# Patient Record
Sex: Female | Born: 1960 | Race: White | Hispanic: No | Marital: Married | Smoking: Never smoker
Health system: Southern US, Community
[De-identification: ages and names within clinical notes are randomized; demographics above are authoritative.]

## PROBLEM LIST (undated history)

## (undated) DIAGNOSIS — N390 Urinary tract infection, site not specified: Secondary | ICD-10-CM

## (undated) DIAGNOSIS — Z8619 Personal history of other infectious and parasitic diseases: Secondary | ICD-10-CM

## (undated) DIAGNOSIS — R519 Headache, unspecified: Secondary | ICD-10-CM

## (undated) DIAGNOSIS — K5792 Diverticulitis of intestine, part unspecified, without perforation or abscess without bleeding: Secondary | ICD-10-CM

## (undated) DIAGNOSIS — R51 Headache: Secondary | ICD-10-CM

## (undated) DIAGNOSIS — K5140 Inflammatory polyps of colon without complications: Secondary | ICD-10-CM

## (undated) DIAGNOSIS — G43909 Migraine, unspecified, not intractable, without status migrainosus: Secondary | ICD-10-CM

## (undated) HISTORY — DX: Inflammatory polyps of colon without complications: K51.40

## (undated) HISTORY — DX: Headache: R51

## (undated) HISTORY — PX: SQUAMOUS CELL CARCINOMA EXCISION: SHX2433

## (undated) HISTORY — DX: Personal history of other infectious and parasitic diseases: Z86.19

## (undated) HISTORY — DX: Headache, unspecified: R51.9

## (undated) HISTORY — DX: Diverticulitis of intestine, part unspecified, without perforation or abscess without bleeding: K57.92

## (undated) HISTORY — DX: Urinary tract infection, site not specified: N39.0

## (undated) HISTORY — DX: Migraine, unspecified, not intractable, without status migrainosus: G43.909

## (undated) HISTORY — PX: ABDOMINAL HYSTERECTOMY: SHX81

## (undated) HISTORY — PX: TOTAL ABDOMINAL HYSTERECTOMY: SHX209

---

## 1998-05-11 ENCOUNTER — Other Ambulatory Visit: Admission: RE | Admit: 1998-05-11 | Discharge: 1998-05-11 | Payer: Self-pay | Admitting: *Deleted

## 1999-05-11 ENCOUNTER — Other Ambulatory Visit: Admission: RE | Admit: 1999-05-11 | Discharge: 1999-05-11 | Payer: Self-pay | Admitting: *Deleted

## 1999-06-28 ENCOUNTER — Other Ambulatory Visit: Admission: RE | Admit: 1999-06-28 | Discharge: 1999-06-28 | Payer: Self-pay | Admitting: *Deleted

## 1999-07-03 ENCOUNTER — Inpatient Hospital Stay (HOSPITAL_COMMUNITY): Admission: RE | Admit: 1999-07-03 | Discharge: 1999-07-05 | Payer: Self-pay | Admitting: *Deleted

## 2003-03-01 ENCOUNTER — Other Ambulatory Visit: Admission: RE | Admit: 2003-03-01 | Discharge: 2003-03-01 | Payer: Self-pay | Admitting: Obstetrics and Gynecology

## 2010-02-19 LAB — CBC AND DIFFERENTIAL
Neutrophils Absolute: 91 /uL
WBC: 16.5 10^3/mL

## 2011-07-03 LAB — HM MAMMOGRAPHY: HM Mammogram: NORMAL

## 2012-01-08 LAB — HM PAP SMEAR: HM Pap smear: NORMAL

## 2012-01-08 LAB — HM MAMMOGRAPHY: HM Mammogram: NORMAL

## 2012-07-23 ENCOUNTER — Emergency Department (HOSPITAL_COMMUNITY): Payer: BC Managed Care – PPO

## 2012-07-23 ENCOUNTER — Encounter (HOSPITAL_COMMUNITY): Payer: Self-pay | Admitting: Emergency Medicine

## 2012-07-23 ENCOUNTER — Emergency Department (HOSPITAL_COMMUNITY)
Admission: EM | Admit: 2012-07-23 | Discharge: 2012-07-23 | Disposition: A | Discharge status: Home / Self Care | Payer: BC Managed Care – PPO | Attending: Emergency Medicine | Admitting: Emergency Medicine

## 2012-07-23 DIAGNOSIS — D72829 Elevated white blood cell count, unspecified: Secondary | ICD-10-CM

## 2012-07-23 DIAGNOSIS — R109 Unspecified abdominal pain: Secondary | ICD-10-CM

## 2012-07-23 DIAGNOSIS — Z79899 Other long term (current) drug therapy: Secondary | ICD-10-CM | POA: Insufficient documentation

## 2012-07-23 LAB — CBC WITH DIFFERENTIAL/PLATELET
Basophils Absolute: 0 10*3/uL (ref 0.0–0.1)
Basophils Relative: 0 % (ref 0–1)
Eosinophils Absolute: 0 10*3/uL (ref 0.0–0.7)
Eosinophils Relative: 0 % (ref 0–5)
HCT: 36.6 % (ref 36.0–46.0)
Hemoglobin: 12.6 g/dL (ref 12.0–15.0)
Lymphocytes Relative: 4 % — ABNORMAL LOW (ref 12–46)
Lymphs Abs: 0.7 10*3/uL (ref 0.7–4.0)
MCH: 29.2 pg (ref 26.0–34.0)
MCHC: 34.4 g/dL (ref 30.0–36.0)
MCV: 84.7 fL (ref 78.0–100.0)
Monocytes Absolute: 0.8 10*3/uL (ref 0.1–1.0)
Monocytes Relative: 5 % (ref 3–12)
Neutro Abs: 14.9 10*3/uL — ABNORMAL HIGH (ref 1.7–7.7)
Neutrophils Relative %: 91 % — ABNORMAL HIGH (ref 43–77)
Platelets: 240 10*3/uL (ref 150–400)
RBC: 4.32 MIL/uL (ref 3.87–5.11)
RDW: 12.5 % (ref 11.5–15.5)
WBC: 16.5 10*3/uL — ABNORMAL HIGH (ref 4.0–10.5)

## 2012-07-23 LAB — BASIC METABOLIC PANEL
BUN: 21 mg/dL (ref 6–23)
CO2: 24 mEq/L (ref 19–32)
Calcium: 9.7 mg/dL (ref 8.4–10.5)
Chloride: 100 mEq/L (ref 96–112)
Creatinine, Ser: 0.65 mg/dL (ref 0.50–1.10)
GFR calc Af Amer: >90 mL/min (ref >90)
GFR calc non Af Amer: >90 mL/min (ref >90)
Glucose, Bld: 141 mg/dL — ABNORMAL HIGH (ref 70–99)
Potassium: 3.7 mEq/L (ref 3.5–5.1)
Sodium: 138 mEq/L (ref 135–145)

## 2012-07-23 LAB — URINALYSIS, ROUTINE W REFLEX MICROSCOPIC
Bilirubin Urine: NEGATIVE
Glucose, UA: NEGATIVE mg/dL
Hgb urine dipstick: NEGATIVE
Ketones, ur: NEGATIVE mg/dL
Leukocytes, UA: NEGATIVE
Nitrite: NEGATIVE
Protein, ur: NEGATIVE mg/dL
Specific Gravity, Urine: 1.019 (ref 1.005–1.030)
Urobilinogen, UA: 0.2 mg/dL (ref 0.0–1.0)
pH: 7.5 (ref 5.0–8.0)

## 2012-07-23 MED ORDER — IOHEXOL 300 MG/ML  SOLN
50.0000 mL | Freq: Once | INTRAMUSCULAR | Status: AC | PRN
  Administered 2012-07-23: 50 mL via ORAL

## 2012-07-23 MED ORDER — ACETAMINOPHEN 325 MG PO TABS
975.0000 mg | ORAL_TABLET | Freq: Once | ORAL | Status: AC
  Administered 2012-07-23: 975 mg via ORAL
  Filled 2012-07-23: qty 3

## 2012-07-23 MED ORDER — MORPHINE SULFATE 4 MG/ML IJ SOLN
4.0000 mg | Freq: Once | INTRAMUSCULAR | Status: AC
  Administered 2012-07-23: 4 mg via INTRAVENOUS
  Filled 2012-07-23: qty 1

## 2012-07-23 MED ORDER — SODIUM CHLORIDE 0.9 % IV SOLN
Freq: Once | INTRAVENOUS | Status: AC
  Administered 2012-07-23: 07:00:00 via INTRAVENOUS

## 2012-07-23 MED ORDER — ONDANSETRON HCL 4 MG PO TABS: 4.0000 mg | ORAL_TABLET | Freq: Four times a day (QID) | ORAL | Status: DC

## 2012-07-23 MED ORDER — HYDROCODONE-ACETAMINOPHEN 5-325 MG PO TABS: 1.0000 | ORAL_TABLET | ORAL | Status: DC | PRN

## 2012-07-23 MED ORDER — IOHEXOL 300 MG/ML  SOLN
100.0000 mL | Freq: Once | INTRAMUSCULAR | Status: AC | PRN
  Administered 2012-07-23: 100 mL via INTRAVENOUS

## 2012-07-23 NOTE — ED Provider Notes (Signed)
History    CSN: [id number] Arrival date & time 07/23/12  0630  First MD Initiated Contact with Patient 07/23/12 [phone]     Chief Complaint  Patient presents with  . Abdominal Pain   (Consider location/radiation/quality/duration/timing/severity/associated sxs/prior Treatment) Patient is a 52 y.o. female presenting with abdominal pain. The history is provided by the patient and the spouse. No language interpreter was used.  Abdominal Pain This is a new problem. The current episode started yesterday. The problem occurs constantly. Associated symptoms include abdominal pain. Pertinent negatives include no chest pain, chills, fever, headaches, nausea or vomiting. Associated symptoms comments: Onset abdominal pain last night in lower center abdomen, radiating to right and left lower quadrants. No nausea, vomiting, fever, dysuria or change in bowel habits. Pain is constant. She reports history of partial hysterectomy secondary to uterine fibroids. .   History reviewed. No pertinent past medical history. Past Surgical History  Procedure Laterality Date  . Abdominal hysterectomy     No family history on file. History  Substance Use Topics  . Smoking status: Never Smoker   . Smokeless tobacco: Not on file  . Alcohol Use: Yes     Comment: occasional   OB History   Grav Para Term Preterm Abortions TAB SAB Ect Mult Living                 Review of Systems  Constitutional: Negative for fever and chills.  HENT: Negative.   Respiratory: Negative.  Negative for shortness of breath.   Cardiovascular: Negative.  Negative for chest pain.  Gastrointestinal: Positive for abdominal pain. Negative for nausea and vomiting.  Genitourinary: Negative for dysuria and flank pain.  Musculoskeletal: Negative.   Skin: Negative.   Neurological: Negative.  Negative for headaches.  Psychiatric/Behavioral: Negative for confusion.    Allergies  Review of patient's allergies indicates no known  allergies.  Home Medications   Current Outpatient Rx  Name  Route  Sig  Dispense  Refill  . BLACK COHOSH PO   Oral   Take 1 capsule by mouth daily.         . calcium carbonate (TUMS - DOSED IN MG ELEMENTAL CALCIUM) 500 MG chewable tablet   Oral   Chew 1 tablet by mouth daily.         . Multiple Vitamin (MULTIVITAMIN WITH MINERALS) TABS   Oral   Take 1 tablet by mouth daily.         . vitamin E 400 UNIT capsule   Oral   Take 400 Units by mouth daily.          BP 107/51  Pulse 79  Temp(Src) 97.9 F (36.6 C) (Oral)  Resp 16  Ht 5\' 5"  (1.651 m)  Wt 120 lb (54.432 kg)  BMI 19.97 kg/m2  SpO2 98% Physical Exam  Constitutional: She is oriented to person, place, and time. She appears well-developed and well-nourished.  HENT:  Head: Normocephalic.  Neck: Normal range of motion. Neck supple.  Cardiovascular: Normal rate and regular rhythm.   Pulmonary/Chest: Effort normal and breath sounds normal.  Abdominal: Soft. Bowel sounds are normal. There is tenderness. There is no rebound and no guarding.  Right greater than left lower abdominal tenderness without rebound or guarding. No upper abdominal tenderness, specifically no RUQ tenderness.    Genitourinary: Vagina normal and uterus normal. No vaginal discharge found.  No adnexal mass. There is bilateral tenderness in adnexa. Cervix absent. No discharge or bleeding in vaginal vault.   Musculoskeletal:  Normal range of motion.  Neurological: She is alert and oriented to person, place, and time.  Skin: Skin is warm and dry. No rash noted.  Psychiatric: She has a normal mood and affect.    ED Course  Procedures (including critical care time) Labs Reviewed  CBC WITH DIFFERENTIAL - Abnormal; Notable for the following:    WBC 16.5 (*)    Neutrophils Relative % 91 (*)    Neutro Abs 14.9 (*)    Lymphocytes Relative 4 (*)    All other components within normal limits  BASIC METABOLIC PANEL - Abnormal; Notable for the  following:    Glucose, Bld 141 (*)    All other components within normal limits  URINALYSIS, ROUTINE W REFLEX MICROSCOPIC   Results for orders placed during the hospital encounter of 07/23/12  URINALYSIS, ROUTINE W REFLEX MICROSCOPIC      Result Value Range   Color, Urine YELLOW  YELLOW   APPearance CLEAR  CLEAR   Specific Gravity, Urine 1.019  1.005 - 1.030   pH 7.5  5.0 - 8.0   Glucose, UA NEGATIVE  NEGATIVE mg/dL   Hgb urine dipstick NEGATIVE  NEGATIVE   Bilirubin Urine NEGATIVE  NEGATIVE   Ketones, ur NEGATIVE  NEGATIVE mg/dL   Protein, ur NEGATIVE  NEGATIVE mg/dL   Urobilinogen, UA 0.2  0.0 - 1.0 mg/dL   Nitrite NEGATIVE  NEGATIVE   Leukocytes, UA NEGATIVE  NEGATIVE  CBC WITH DIFFERENTIAL      Result Value Range   WBC 16.5 (*) 4.0 - 10.5 K/uL   RBC 4.32  3.87 - 5.11 MIL/uL   Hemoglobin 12.6  12.0 - 15.0 g/dL   HCT 16.1  09.6 - 04.5 %   MCV 84.7  78.0 - 100.0 fL   MCH 29.2  26.0 - 34.0 pg   MCHC 34.4  30.0 - 36.0 g/dL   RDW 40.9  81.1 - 91.4 %   Platelets 240  150 - 400 K/uL   Neutrophils Relative % 91 (*) 43 - 77 %   Neutro Abs 14.9 (*) 1.7 - 7.7 K/uL   Lymphocytes Relative 4 (*) 12 - 46 %   Lymphs Abs 0.7  0.7 - 4.0 K/uL   Monocytes Relative 5  3 - 12 %   Monocytes Absolute 0.8  0.1 - 1.0 K/uL   Eosinophils Relative 0  0 - 5 %   Eosinophils Absolute 0.0  0.0 - 0.7 K/uL   Basophils Relative 0  0 - 1 %   Basophils Absolute 0.0  0.0 - 0.1 K/uL  BASIC METABOLIC PANEL      Result Value Range   Sodium 138  135 - 145 mEq/L   Potassium 3.7  3.5 - 5.1 mEq/L   Chloride 100  96 - 112 mEq/L   CO2 24  19 - 32 mEq/L   Glucose, Bld 141 (*) 70 - 99 mg/dL   BUN 21  6 - 23 mg/dL   Creatinine, Ser 7.82  0.50 - 1.10 mg/dL   Calcium 9.7  8.4 - 95.6 mg/dL   GFR calc non Af Amer >90  >90 mL/min   GFR calc Af Amer >90  >90 mL/min   US Transvaginal Non-ob  07/23/2012   *RADIOLOGY REPORT*  Clinical Data: Abdominal and pelvic pain.  TRANSABDOMINAL AND TRANSVAGINAL ULTRASOUND OF  PELVIS Technique:  Both transabdominal and transvaginal ultrasound examinations of the pelvis were performed. Transabdominal technique was performed for global imaging of the pelvis including cuff, ovaries,  adnexal regions, and pelvic cul-de-sac.  It was necessary to proceed with endovaginal exam following the transabdominal exam to visualize the vaginal cuff and adnexa.  Comparison:  Concurrent CT  Findings:  Uterus: Has been surgically removed.  A normal vaginal cuff is seen  Endometrium: Not applicable  Right ovary:  Is not seen with confidence either transabdominally or endovaginally  Left ovary: Is not seen with confidence either transabdominally or endovaginally  Other findings: No pelvic fluid or definite adnexal masses are seen  IMPRESSION: Non visualized ovaries.  Normal vaginal cuff.   Original Report Authenticated By: Rhodia Albright, M.D.   US Pelvis Complete  07/23/2012   *RADIOLOGY REPORT*  Clinical Data: Abdominal and pelvic pain.  TRANSABDOMINAL AND TRANSVAGINAL ULTRASOUND OF PELVIS Technique:  Both transabdominal and transvaginal ultrasound examinations of the pelvis were performed. Transabdominal technique was performed for global imaging of the pelvis including cuff, ovaries, adnexal regions, and pelvic cul-de-sac.  It was necessary to proceed with endovaginal exam following the transabdominal exam to visualize the vaginal cuff and adnexa.  Comparison:  Concurrent CT  Findings:  Uterus: Has been surgically removed.  A normal vaginal cuff is seen  Endometrium: Not applicable  Right ovary:  Is not seen with confidence either transabdominally or endovaginally  Left ovary: Is not seen with confidence either transabdominally or endovaginally  Other findings: No pelvic fluid or definite adnexal masses are seen  IMPRESSION: Non visualized ovaries.  Normal vaginal cuff.   Original Report Authenticated By: Rhodia Albright, M.D.   Ct Abdomen Pelvis W Contrast  07/23/2012   **ADDENDUM** CREATED:  07/23/2012 10:16:07  The patient has  right pelvic pain.  The right ovary is not seen. The uterus has been removed.  There is mild fullness of the left ovary with a small amount of free fluid in the left lower pelvis.  **END ADDENDUM** SIGNED BY: Dineen Kid. Chestine Spore, M.D.  07/23/2012   *RADIOLOGY REPORT*  Clinical Data: Abdominal pain  CT ABDOMEN AND PELVIS WITH CONTRAST  Technique:  Multidetector CT imaging of the abdomen and pelvis was performed following the standard protocol during bolus administration of intravenous contrast.  Contrast: OMNIPAQUE IOHEXOL 300 MG/ML  SOLN  Comparison: None.  Findings: Lung bases are clear.  Heart size is normal.  Liver and bile ducts are normal.  Multiple gallstones are present in the gallbladder.  The gallbladder is distended without gallbladder wall thickening.  The pancreas and spleen are normal.  The kidneys are normal without obstruction or mass.  No renal calculi.  Negative for bowel obstruction.  The appendix is normal.  No bowel thickening is present.  Negative for mass or adenopathy.  Negative for lumbar fracture.  IMPRESSION: Gallstones.  No gallbladder wall thickening or biliary ductal dilatation.   Original Report Authenticated By: Janeece Riggers, M.D.   No results found. No diagnosis found. 1. Abdominal pain 2. Leukocytosis  MDM  She is more comfortable with IV medications. Complains of headache onset after arrival, treated with Tylenol. She has an unexplained leukocytosis with normal CT and pelvic US. She is stable for discharge and is encouraged to follow up with her PCP for further out patient evaluation.  Arnoldo Hooker, PA-C 07/23/12 1254

## 2012-07-23 NOTE — ED Notes (Signed)
Per EMS pt transported from home c/o lower abd pain onset 2200 last night, denies n/v/d per EMS. Per EMS pt c/o becoming dizzy after urination last evening.

## 2012-07-23 NOTE — ED Notes (Signed)
Pt c/o mid low abd pain onset last night, pt denies GYN s/s, denies urinary s/s. Denies n/v/d. Pt is acutely tender RLQ. Pt unable to straighten legs on stretcher d/t pain. Husband at bedside.

## 2012-07-23 NOTE — ED Notes (Signed)
Patient transported to CT 

## 2012-07-23 NOTE — ED Notes (Signed)
[id number]<FA> Expected date:<BR> Expected time:<BR> Means of arrival:<BR> Comments:<BR> EMS 52yo F, abd pain, c/o dizziness upon urination

## 2012-07-24 NOTE — ED Provider Notes (Signed)
Medical screening examination/treatment/procedure(s) were conducted as a shared visit with non-physician practitioner(s) and myself.  I personally evaluated the patient during the encounter.  No acute abdomen. Ultrasound of pelvis shows no acute anomalies.   CT of abdomen and pelvis show incidental gallstones  Donnetta Hutching, MD 07/24/12 1021

## 2012-08-07 LAB — HM COLONOSCOPY: HM Colonoscopy: NORMAL

## 2012-08-12 LAB — BASIC METABOLIC PANEL
BUN: 17 mg/dL (ref 4–21)
Creatinine: 0.7 mg/dL (ref <=1.1)
Sodium: 139 mmol/L (ref 137–147)

## 2012-08-24 LAB — HM COLONOSCOPY

## 2012-09-25 ENCOUNTER — Ambulatory Visit (INDEPENDENT_AMBULATORY_CARE_PROVIDER_SITE_OTHER): Payer: Self-pay | Admitting: General Surgery

## 2012-09-28 ENCOUNTER — Ambulatory Visit (INDEPENDENT_AMBULATORY_CARE_PROVIDER_SITE_OTHER): Payer: BC Managed Care – PPO | Admitting: General Surgery

## 2012-09-28 ENCOUNTER — Encounter (INDEPENDENT_AMBULATORY_CARE_PROVIDER_SITE_OTHER): Payer: Self-pay | Admitting: General Surgery

## 2012-09-28 VITALS — BP 110/78 | HR 72 | Resp 14 | Ht 65.0 in | Wt 119.6 lb

## 2012-09-28 DIAGNOSIS — K8020 Calculus of gallbladder without cholecystitis without obstruction: Secondary | ICD-10-CM

## 2012-09-28 NOTE — Progress Notes (Signed)
Subjective:     Patient ID: Melanie Park, female   DOB: Jul 23, 1960, 52 y.o.   MRN: [id number]  HPI The patient is a 52 year old female who was referred by Dr. Paulino Rily for evaluation of biliary colic. In April of this year patient was seen in the ER secondary to lower abdominal pain. The patient stated she had not had any pain like this previously. Upon evaluation in the patient underwent laboratory studies as well as a CT scan and ultrasound which revealed a dilated gallbladder with stones, and sphincter within the pelvis. At that time there was liver panel that was ordered.  At the time the patient had no nausea or vomiting, or fevers.  Discussed with the patient she has not had pain in the right upper quadrant or epigastrium, at any time.   Review of Systems  Constitutional: Negative.   HENT: Negative.   Respiratory: Negative.   Cardiovascular: Negative.   Gastrointestinal: Negative.   Neurological: Negative.   All other systems reviewed and are negative.       Objective:   Physical Exam  Constitutional: She is oriented to person, place, and time. She appears well-developed and well-nourished.  HENT:  Head: Normocephalic and atraumatic.  Eyes: Conjunctivae and EOM are normal. Pupils are equal, round, and reactive to light.  Neck: Normal range of motion.  Cardiovascular: Normal rate, regular rhythm and normal heart sounds.   Pulmonary/Chest: Effort normal and breath sounds normal.  Abdominal: There is tenderness in the right upper quadrant.  Musculoskeletal: Normal range of motion.  Neurological: She is alert and oriented to person, place, and time.  Skin: Skin is warm and dry.       Assessment:     52 year old female with gallstones, nonsymptomatic     Plan:     1. I discussed with the patient the results of the CT scan and the likelihood that this is not related to her gallbladder or biliary colic. I discussed the signs and symptoms of biliary colic and her symptoms  had to be assisted with her gallstones. We discussed the fact that usually this pain occurs after eating high fatty meals or spicy foods an occurs in the right upper quadrant.  Should any pain in this area  Occur I would be happy to see the patient back for surgical consultation. 2. The patient follow up as needed

## 2013-08-02 ENCOUNTER — Ambulatory Visit (INDEPENDENT_AMBULATORY_CARE_PROVIDER_SITE_OTHER): Payer: BC Managed Care – PPO | Admitting: Family Medicine

## 2013-08-02 ENCOUNTER — Encounter: Payer: Self-pay | Admitting: Family Medicine

## 2013-08-02 VITALS — BP 120/84 | HR 64 | Temp 98.2°F | Resp 16 | Ht 64.25 in | Wt 122.2 lb

## 2013-08-02 DIAGNOSIS — Z0000 Encounter for general adult medical examination without abnormal findings: Secondary | ICD-10-CM

## 2013-08-02 NOTE — Patient Instructions (Signed)
Follow up in 1 year or as needed Keep up the good work!  You look great! We'll notify you of your lab results and make any changes if needed Call and ask your insurance company about the shingles vaccine- if they cover it, you can schedule a nurse visit Call with any questions or concerns Welcome!  We're glad to have you!

## 2013-08-02 NOTE — Progress Notes (Signed)
Pre visit review using our clinic review tool, if applicable. No additional management support is needed unless otherwise documented below in the visit note. 

## 2013-08-02 NOTE — Progress Notes (Signed)
   Subjective:    Patient ID: Melanie Park, female    DOB: 1960/10/08, 53 y.o.   MRN: [id number]  HPI New to establish.  Previous MD- Paulino RilyWolters Deboraha Sprang([facility])  GI- [facility]  CPE- UTD on colonoscopy, mammo.  No need for paps due to hysterectomy but ovaries remain.   Review of Systems Patient reports no vision/ hearing changes, adenopathy,fever, weight change,  persistant/recurrent hoarseness , swallowing issues, chest pain, palpitations, edema, persistant/recurrent cough, hemoptysis, dyspnea (rest/exertional/paroxysmal nocturnal), gastrointestinal bleeding (melena, rectal bleeding), abdominal pain, significant heartburn, bowel changes, GU symptoms (dysuria, hematuria, incontinence), Gyn symptoms (abnormal  bleeding, pain),  syncope, focal weakness, memory loss, numbness & tingling, skin/hair/nail changes, abnormal bruising or bleeding, anxiety, or depression.     Objective:   Physical Exam General Appearance:    Alert, cooperative, no distress, appears stated age  Head:    Normocephalic, without obvious abnormality, atraumatic  Eyes:    PERRL, conjunctiva/corneas clear, EOM's intact, fundi    benign, both eyes  Ears:    Normal TM's and external ear canals, both ears  Nose:   Nares normal, septum midline, mucosa normal, no drainage    or sinus tenderness  Throat:   Lips, mucosa, and tongue normal; teeth and gums normal  Neck:   Supple, symmetrical, trachea midline, no adenopathy;    Thyroid: no enlargement/tenderness/nodules  Back:     Symmetric, no curvature, ROM normal, no CVA tenderness  Lungs:     Clear to auscultation bilaterally, respirations unlabored  Chest Wall:    No tenderness or deformity   Heart:    Regular rate and rhythm, S1 and S2 normal, no murmur, rub   or gallop  Breast Exam:    Deferred to mammo  Abdomen:     Soft, non-tender, bowel sounds active all four quadrants,    no masses, no organomegaly  Genitalia:    Deferred at pt's request  Rectal:    Extremities:   Extremities  normal, atraumatic, no cyanosis or edema  Pulses:   2+ and symmetric all extremities  Skin:   Skin color, texture, turgor normal, no rashes or lesions  Lymph nodes:   Cervical, supraclavicular, and axillary nodes normal  Neurologic:   CNII-XII intact, normal strength, sensation and reflexes    throughout          Assessment & Plan:

## 2013-08-02 NOTE — Assessment & Plan Note (Signed)
Pt's PE WNL.  UTD on mammo, colonoscopy.  Check labs.  Anticipatory guidance provided.  

## 2013-08-03 LAB — LIPID PANEL
Cholesterol: 231 mg/dL — ABNORMAL HIGH (ref 0–200)
HDL: 73.9 mg/dL (ref >39.00)
LDL Cholesterol: 136 mg/dL — ABNORMAL HIGH (ref 0–99)
NonHDL: 157.1
Total CHOL/HDL Ratio: 3
Triglycerides: 104 mg/dL (ref 0.0–149.0)
VLDL: 20.8 mg/dL (ref 0.0–40.0)

## 2013-08-03 LAB — CBC WITH DIFFERENTIAL/PLATELET
Basophils Absolute: 0.1 10*3/uL (ref 0.0–0.1)
Basophils Relative: 0.8 % (ref 0.0–3.0)
Eosinophils Absolute: 0.1 10*3/uL (ref 0.0–0.7)
Eosinophils Relative: 2.3 % (ref 0.0–5.0)
HCT: 43.8 % (ref 36.0–46.0)
Hemoglobin: 14.6 g/dL (ref 12.0–15.0)
Lymphocytes Relative: 30.2 % (ref 12.0–46.0)
Lymphs Abs: 1.8 10*3/uL (ref 0.7–4.0)
MCHC: 33.4 g/dL (ref 30.0–36.0)
MCV: 91.8 fl (ref 78.0–100.0)
Monocytes Absolute: 0.3 10*3/uL (ref 0.1–1.0)
Monocytes Relative: 5.6 % (ref 3.0–12.0)
Neutro Abs: 3.7 10*3/uL (ref 1.4–7.7)
Neutrophils Relative %: 61.1 % (ref 43.0–77.0)
Platelets: 208 10*3/uL (ref 150.0–400.0)
RBC: 4.77 Mil/uL (ref 3.87–5.11)
RDW: 14.6 % (ref 11.5–15.5)
WBC: 6.1 10*3/uL (ref 4.0–10.5)

## 2013-08-03 LAB — HEPATIC FUNCTION PANEL
ALT: 27 U/L (ref 0–35)
AST: 25 U/L (ref 0–37)
Albumin: 4.5 g/dL (ref 3.5–5.2)
Alkaline Phosphatase: 80 U/L (ref 39–117)
Bilirubin, Direct: 0.3 mg/dL (ref 0.0–0.3)
Total Bilirubin: 0.9 mg/dL (ref 0.2–1.2)
Total Protein: 7.8 g/dL (ref 6.0–8.3)

## 2013-08-03 LAB — BASIC METABOLIC PANEL
BUN: 20 mg/dL (ref 6–23)
CO2: 27 mEq/L (ref 19–32)
Calcium: 9.5 mg/dL (ref 8.4–10.5)
Chloride: 103 mEq/L (ref 96–112)
Creatinine, Ser: 0.8 mg/dL (ref 0.4–1.2)
GFR: 81.47 mL/min (ref >60.00)
Glucose, Bld: 87 mg/dL (ref 70–99)
Potassium: 4.4 mEq/L (ref 3.5–5.1)
Sodium: 138 mEq/L (ref 135–145)

## 2013-08-03 LAB — VITAMIN D 25 HYDROXY (VIT D DEFICIENCY, FRACTURES): VITD: 50.94 ng/mL (ref 30.00–100.00)

## 2013-08-03 LAB — TSH: TSH: 0.86 u[IU]/mL (ref 0.35–4.50)

## 2013-08-10 ENCOUNTER — Encounter: Payer: Self-pay | Admitting: General Practice

## 2014-01-18 ENCOUNTER — Encounter: Payer: Self-pay | Admitting: Physician Assistant

## 2014-01-18 ENCOUNTER — Ambulatory Visit (INDEPENDENT_AMBULATORY_CARE_PROVIDER_SITE_OTHER): Payer: BLUE CROSS/BLUE SHIELD | Admitting: Physician Assistant

## 2014-01-18 VITALS — BP 125/72 | HR 70 | Temp 97.8°F | Resp 16 | Ht 65.0 in | Wt 124.2 lb

## 2014-01-18 DIAGNOSIS — R399 Unspecified symptoms and signs involving the genitourinary system: Secondary | ICD-10-CM

## 2014-01-18 DIAGNOSIS — N3000 Acute cystitis without hematuria: Secondary | ICD-10-CM

## 2014-01-18 LAB — POCT URINALYSIS DIPSTICK
Bilirubin, UA: NEGATIVE
Glucose, UA: NEGATIVE
Ketones, UA: NEGATIVE
Nitrite, UA: NEGATIVE
Protein, UA: NEGATIVE
Spec Grav, UA: 1.025
Urobilinogen, UA: 0.2
pH, UA: 6

## 2014-01-18 MED ORDER — CIPROFLOXACIN HCL 250 MG PO TABS: 250.0000 mg | ORAL_TABLET | Freq: Two times a day (BID) | ORAL | Status: DC

## 2014-01-18 NOTE — Patient Instructions (Signed)
Your symptoms are consistent with a bladder infection, also called acute cystitis. Please take your antibiotic (Cipro) as directed until all pills are gone.  Stay very well hydrated.  Consider a daily probiotic (Align, Culturelle, or Activia) to help prevent stomach upset caused by the antibiotic.  Taking a probiotic daily may also help prevent recurrent UTIs.  Also consider taking AZO (Phenazopyridine) tablets to help decrease pain with urination.  I will call you with your urine testing results.  We will change antibiotics if indicated.  Call or return to clinic if symptoms are not resolved by completion of antibiotic.   Urinary Tract Infection A urinary tract infection (UTI) can occur any place along the urinary tract. The tract includes the kidneys, ureters, bladder, and urethra. A type of germ called bacteria often causes a UTI. UTIs are often helped with antibiotic medicine.  HOME CARE   If given, take antibiotics as told by your doctor. Finish them even if you start to feel better.  Drink enough fluids to keep your pee (urine) clear or pale yellow.  Avoid tea, drinks with caffeine, and bubbly (carbonated) drinks.  Pee often. Avoid holding your pee in for a long time.  Pee before and after having sex (intercourse).  Wipe from front to back after you poop (bowel movement) if you are a woman. Use each tissue only once. GET HELP RIGHT AWAY IF:   You have back pain.  You have lower belly (abdominal) pain.  You have chills.  You feel sick to your stomach (nauseous).  You throw up (vomit).  Your burning or discomfort with peeing does not go away.  You have a fever.  Your symptoms are not better in 3 days. MAKE SURE YOU:   Understand these instructions.  Will watch your condition.  Will get help right away if you are not doing well or get worse. Document Released: 06/12/2007 Document Revised: 09/18/2011 Document Reviewed: 07/25/2011 Exit[employer] Patient Information 2015  [employer]. This information is not intended to replace advice given to you by your health care provider. Make sure you discuss any questions you have with your health care provider.    

## 2014-01-18 NOTE — Progress Notes (Signed)
  ZOX:[id number]:Melanie Beverely Lowabori, MD Chief Complaint  Patient presents with  . Urinary Tract Infection    Pt c/o urinary frequency & urgency, burning with urination started today    Current Issues:  Presents with 1 day of dysuria, urinary urgency and urinary frequency Associated symptoms include:  cloudy urine and foul-smelling urine  There is a previous history of of similar symptoms. Sexually active:  Yes with female.   No concern for STI.  Prior to Admission medications   Medication Sig Start Date End Date Taking? Authorizing Provider  calcium carbonate (TUMS - DOSED IN MG ELEMENTAL CALCIUM) 500 MG chewable tablet Chew 1 tablet by mouth daily.   Yes Historical Provider, MD  Multiple Vitamin (MULTIVITAMIN WITH MINERALS) TABS Take 1 tablet by mouth daily.   Yes Historical Provider, MD  vitamin E 400 UNIT capsule Take 400 Units by mouth daily.   Yes Historical Provider, MD    Review of Systems: Pertinent ROS are l  PE:  BP 125/72 mmHg  Pulse 70  Temp(Src) 97.8 F (36.6 C) (Oral)  Resp 16  Ht 5\' 5"  (1.651 m)  Wt 124 lb 4 oz (56.359 kg)  BMI 20.68 kg/m2  SpO2 100%  BP 125/72 mmHg  Pulse 70  Temp(Src) 97.8 F (36.6 C) (Oral)  Resp 16  Ht 5\' 5"  (1.651 m)  Wt 124 lb 4 oz (56.359 kg)  BMI 20.68 kg/m2  SpO2 100% General appearance: alert, cooperative and appears stated age Head: Normocephalic, without obvious abnormality, atraumatic Lungs: clear to auscultation bilaterally Heart: regular rate and rhythm, S1, S2 normal, no murmur, click, rub or gallop Abdomen: soft, non-tender; bowel sounds normal; no masses,  no organomegaly  Results for orders placed or performed in visit on 08/10/13  HM MAMMOGRAPHY  Result Value Ref Range   HM Mammogram normal   CBC and differential  Result Value Ref Range   Neutrophils Absolute 91 /L   WBC 16.5 10^3/mL  Basic metabolic panel  Result Value Ref Range   BUN 17 4 - 21 mg/dL   Creatinine 0.7 .5 - 1.1 mg/dL   Sodium 811139 914137 - 782147 mmol/L  HM  COLONOSCOPY  Result Value Ref Range   HM Colonoscopy colonic polyps     Assessment and Plan:  1. UTI  Rx Cipro 250 mg BID x 3 days.  Increase fluids.  Rest.  AZO with Cranberry.  Urine sent for culture.  Will alter therapy if indicated by result. - POCT urinalysis dipstick; Standing - POCT urinalysis dipstick

## 2014-01-18 NOTE — Progress Notes (Signed)
Pre visit review using our clinic review tool, if applicable. No additional management support is needed unless otherwise documented below in the visit note/SLS  

## 2014-01-20 DIAGNOSIS — N3000 Acute cystitis without hematuria: Secondary | ICD-10-CM | POA: Insufficient documentation

## 2014-01-21 LAB — URINE CULTURE: Colony Count: >=100000

## 2014-08-08 ENCOUNTER — Encounter: Payer: BC Managed Care – PPO | Admitting: Family Medicine

## 2014-11-11 ENCOUNTER — Telehealth: Payer: Self-pay | Admitting: Behavioral Health

## 2014-11-11 NOTE — Telephone Encounter (Signed)
Unable to reach patient at time of Pre-Visit Call.  Left message for patient to return call when available.    

## 2014-11-14 ENCOUNTER — Encounter: Payer: Self-pay | Admitting: Family Medicine

## 2014-11-14 ENCOUNTER — Ambulatory Visit (INDEPENDENT_AMBULATORY_CARE_PROVIDER_SITE_OTHER): Payer: BLUE CROSS/BLUE SHIELD | Admitting: Family Medicine

## 2014-11-14 VITALS — BP 118/74 | HR 65 | Temp 98.1°F | Resp 16 | Ht 65.0 in | Wt 125.2 lb

## 2014-11-14 DIAGNOSIS — IMO0001 Reserved for inherently not codable concepts without codable children: Secondary | ICD-10-CM

## 2014-11-14 DIAGNOSIS — Z0000 Encounter for general adult medical examination without abnormal findings: Secondary | ICD-10-CM | POA: Diagnosis not present

## 2014-11-14 DIAGNOSIS — Z1231 Encounter for screening mammogram for malignant neoplasm of breast: Secondary | ICD-10-CM | POA: Diagnosis not present

## 2014-11-14 DIAGNOSIS — F526 Dyspareunia not due to a substance or known physiological condition: Secondary | ICD-10-CM | POA: Diagnosis not present

## 2014-11-14 DIAGNOSIS — Z780 Asymptomatic menopausal state: Secondary | ICD-10-CM | POA: Diagnosis not present

## 2014-11-14 LAB — CBC WITH DIFFERENTIAL/PLATELET
Basophils Absolute: 0 10*3/uL (ref 0.0–0.1)
Basophils Relative: 0.7 % (ref 0.0–3.0)
Eosinophils Absolute: 0.2 10*3/uL (ref 0.0–0.7)
Eosinophils Relative: 3.5 % (ref 0.0–5.0)
HCT: 38 % (ref 36.0–46.0)
Hemoglobin: 12.6 g/dL (ref 12.0–15.0)
Lymphocytes Relative: 26.4 % (ref 12.0–46.0)
Lymphs Abs: 1.3 10*3/uL (ref 0.7–4.0)
MCHC: 33.2 g/dL (ref 30.0–36.0)
MCV: 87.3 fl (ref 78.0–100.0)
Monocytes Absolute: 0.4 10*3/uL (ref 0.1–1.0)
Monocytes Relative: 9 % (ref 3.0–12.0)
Neutro Abs: 2.9 10*3/uL (ref 1.4–7.7)
Neutrophils Relative %: 60.4 % (ref 43.0–77.0)
Platelets: 268 10*3/uL (ref 150.0–400.0)
RBC: 4.35 Mil/uL (ref 3.87–5.11)
RDW: 13.3 % (ref 11.5–15.5)
WBC: 4.8 10*3/uL (ref 4.0–10.5)

## 2014-11-14 LAB — HEPATIC FUNCTION PANEL
ALT: 25 U/L (ref 0–35)
AST: 23 U/L (ref 0–37)
Albumin: 4.2 g/dL (ref 3.5–5.2)
Alkaline Phosphatase: 57 U/L (ref 39–117)
Bilirubin, Direct: 0.1 mg/dL (ref 0.0–0.3)
Total Bilirubin: 0.8 mg/dL (ref 0.2–1.2)
Total Protein: 6.8 g/dL (ref 6.0–8.3)

## 2014-11-14 LAB — BASIC METABOLIC PANEL
BUN: 17 mg/dL (ref 6–23)
CO2: 30 mEq/L (ref 19–32)
Calcium: 9.8 mg/dL (ref 8.4–10.5)
Chloride: 105 mEq/L (ref 96–112)
Creatinine, Ser: 0.81 mg/dL (ref 0.40–1.20)
GFR: 77.63 mL/min (ref >60.00)
Glucose, Bld: 91 mg/dL (ref 70–99)
Potassium: 3.9 mEq/L (ref 3.5–5.1)
Sodium: 143 mEq/L (ref 135–145)

## 2014-11-14 LAB — LIPID PANEL
Cholesterol: 196 mg/dL (ref 0–200)
HDL: 73.9 mg/dL (ref >39.00)
LDL Cholesterol: 112 mg/dL — ABNORMAL HIGH (ref 0–99)
NonHDL: 121.76
Total CHOL/HDL Ratio: 3
Triglycerides: 49 mg/dL (ref 0.0–149.0)
VLDL: 9.8 mg/dL (ref 0.0–40.0)

## 2014-11-14 LAB — VITAMIN D 25 HYDROXY (VIT D DEFICIENCY, FRACTURES): VITD: 47.61 ng/mL (ref 30.00–100.00)

## 2014-11-14 LAB — TSH: TSH: 2.19 u[IU]/mL (ref 0.35–4.50)

## 2014-11-14 MED ORDER — OSPEMIFENE 60 MG PO TABS: 1.0000 | ORAL_TABLET | Freq: Every day | ORAL | Status: DC

## 2014-11-14 NOTE — Progress Notes (Signed)
Pre visit review using our clinic review tool, if applicable. No additional management support is needed unless otherwise documented below in the visit note. 

## 2014-11-14 NOTE — Assessment & Plan Note (Signed)
New.  Due to post-menopausal state.  Discussed various tx options- estrogen replacement, topical lubricants, Osphena.  Pt opts to start Osphena and monitor for improvement.  Stressed need for routine mammos while taking any estrogen modulators.  Pt in agreement.  Will follow.

## 2014-11-14 NOTE — Patient Instructions (Signed)
Follow up in 1 year or as needed We'll notify you of your lab results and make any changes if needed We'll call you with your mammogram and bone density appts Start the Osphena- 1 tab daily w/ food.  Go to their website for a coupon Try and increase your water intake.  Also increase your potassium in the form of leafy greens, citrus fruits, bananas, OJ to help w/ leg cramps Start an OTC probiotic for the gas and bloating Call with any questions or concerns If you want to join us at the new [place] office, any scheduled appointments will automatically transfer and we will see you at [address], [place], [place] [id number] Happy Holidays!!!

## 2014-11-14 NOTE — Progress Notes (Signed)
   Subjective:    Patient ID: Melanie Park, female    DOB: [birth year], 54 y.o.   MRN: [id number]  HPI CPE- UTD on colonoscopy w/ Dr Ewing SchleinMagod.  UTD on pap (due next year).  Due for mammo and DEXA ([facility]).   Review of Systems Patient reports no vision/ hearing changes, adenopathy,fever, weight change,  persistant/recurrent hoarseness , swallowing issues, chest pain, palpitations, edema, persistant/recurrent cough, hemoptysis, dyspnea (rest/exertional/paroxysmal nocturnal), gastrointestinal bleeding (melena, rectal bleeding), abdominal pain, significant heartburn, bowel changes, GU symptoms (dysuria, hematuria, incontinence),  syncope, focal weakness, memory loss, numbness & tingling, skin/hair/nail changes, abnormal bruising or bleeding, anxiety, or depression.   + painful intercourse    Objective:   Physical Exam General Appearance:    Alert, cooperative, no distress, appears stated age  Head:    Normocephalic, without obvious abnormality, atraumatic  Eyes:    PERRL, conjunctiva/corneas clear, EOM's intact, fundi    benign, both eyes  Ears:    Normal TM's and external ear canals, both ears  Nose:   Nares normal, septum midline, mucosa normal, no drainage    or sinus tenderness  Throat:   Lips, mucosa, and tongue normal; teeth and gums normal  Neck:   Supple, symmetrical, trachea midline, no adenopathy;    Thyroid: no enlargement/tenderness/nodules  Back:     Symmetric, no curvature, ROM normal, no CVA tenderness  Lungs:     Clear to auscultation bilaterally, respirations unlabored  Chest Wall:    No tenderness or deformity   Heart:    Regular rate and rhythm, S1 and S2 normal, no murmur, rub   or gallop  Breast Exam:    Deferred to mammo  Abdomen:     Soft, non-tender, bowel sounds active all four quadrants,    no masses, no organomegaly  Genitalia:    Deferred  Rectal:    Extremities:   Extremities normal, atraumatic, no cyanosis or edema  Pulses:   2+ and symmetric all extremities   Skin:   Skin color, texture, turgor normal, no rashes or lesions  Lymph nodes:   Cervical, supraclavicular, and axillary nodes normal  Neurologic:   CNII-XII intact, normal strength, sensation and reflexes    throughout          Assessment & Plan:

## 2014-11-14 NOTE — Assessment & Plan Note (Signed)
Pt's PE WNL.  UTD on colonoscopy, pap.  Due for mammo and DEXA- ordered.  Check labs.  Anticipatory guidance provided.

## 2014-11-18 ENCOUNTER — Encounter: Payer: BLUE CROSS/BLUE SHIELD | Admitting: Family Medicine

## 2014-12-16 LAB — HM MAMMOGRAPHY

## 2014-12-16 LAB — HM DEXA SCAN

## 2014-12-22 ENCOUNTER — Encounter: Payer: Self-pay | Admitting: General Practice

## 2014-12-30 ENCOUNTER — Other Ambulatory Visit: Payer: Self-pay | Admitting: General Practice

## 2014-12-30 MED ORDER — ALENDRONATE SODIUM 70 MG PO TABS: 70.0000 mg | ORAL_TABLET | ORAL | Status: DC

## 2015-01-12 IMAGING — CT CT ABD-PELV W/ CM
1 of 3 series · 14 of 32 positions shown, 19 images · IV contrast (OMNIPAQUE 300)
Comparison: None.

***ADDENDUM*** CREATED: 07/23/2012 [DATE]

The patient has  right pelvic pain.  The right ovary is not seen.
The uterus has been removed.  There is mild fullness of the left
ovary with a small amount of free fluid in the left lower pelvis.
***END ADDENDUM*** SIGNED BY: January Haffner, M.D.
CLINICAL DATA: Abdominal pain
CT ABDOMEN AND PELVIS WITH CONTRAST
TECHNIQUE: Multidetector CT imaging of the abdomen and pelvis was
performed following the standard protocol during bolus
administration of intravenous contrast.
Contrast: 100mL OMNIPAQUE IOHEXOL 300 MG/ML  SOLN

[Series 2: abd/pel with · axial · 0.63mm/px · z∈[+1064,+1419]mm · 14 of 81 slices shown, 19 images]
[im 5/81  soft-tissue]
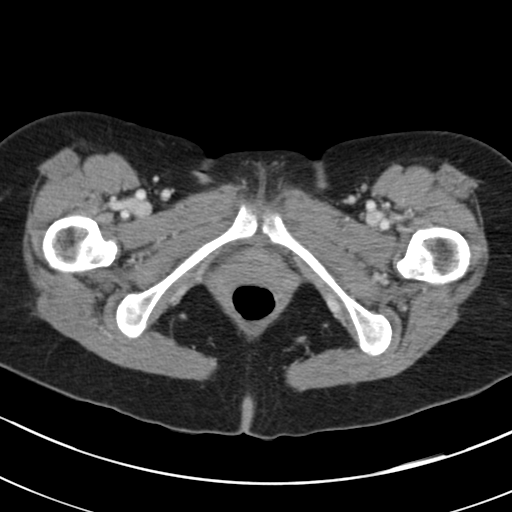
[im 5/81  bone]
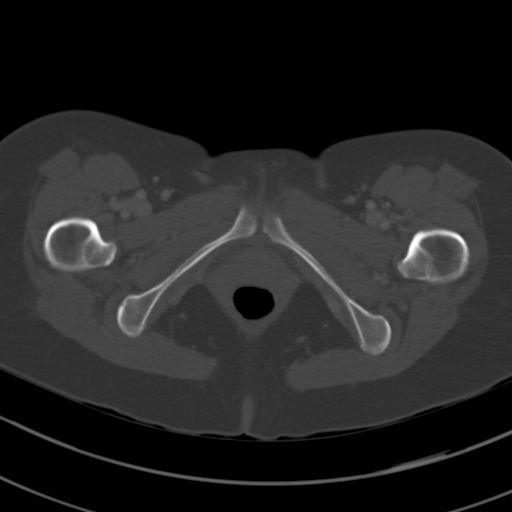
[im 13/81  soft-tissue]
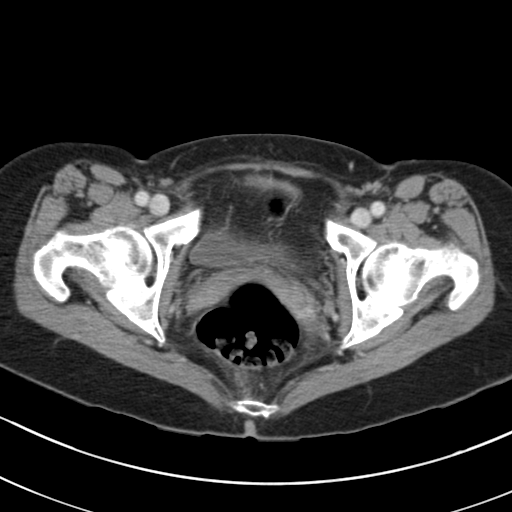
[im 17/81  soft-tissue]
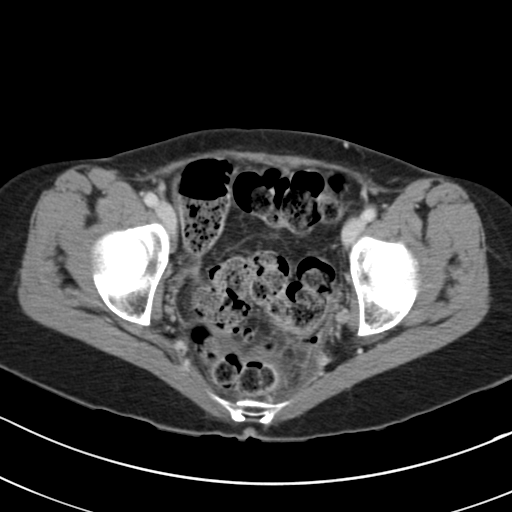
[im 22/81  soft-tissue]
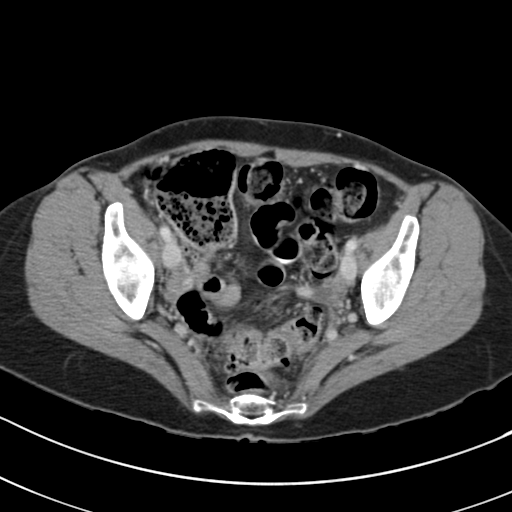
[im 30/81  soft-tissue]
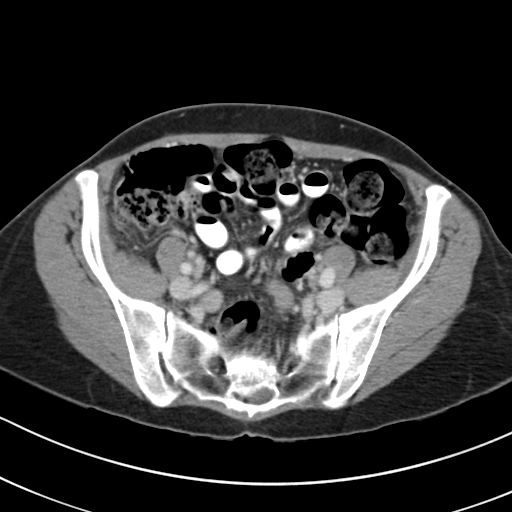
[im 34/81  soft-tissue]
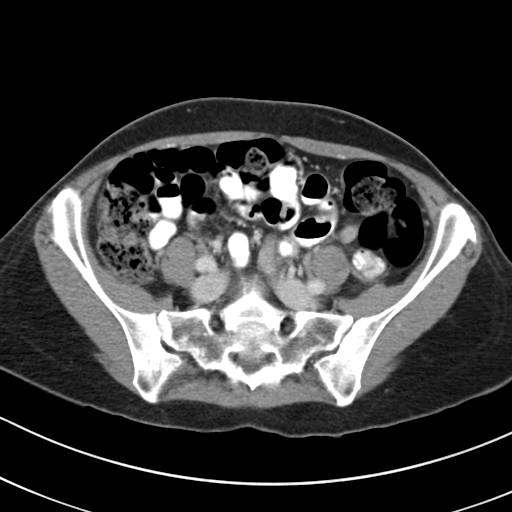
[im 43/81  soft-tissue]
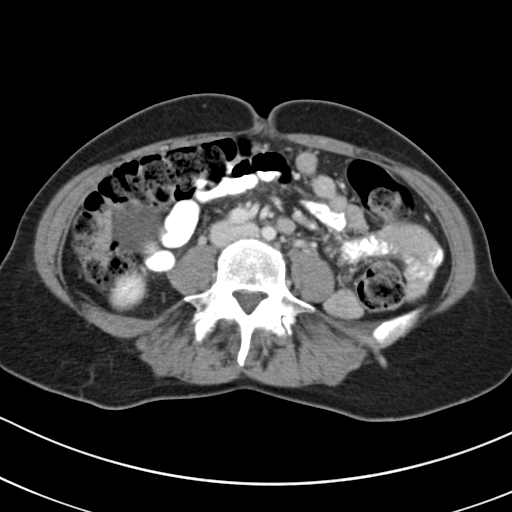
[im 47/81  soft-tissue]
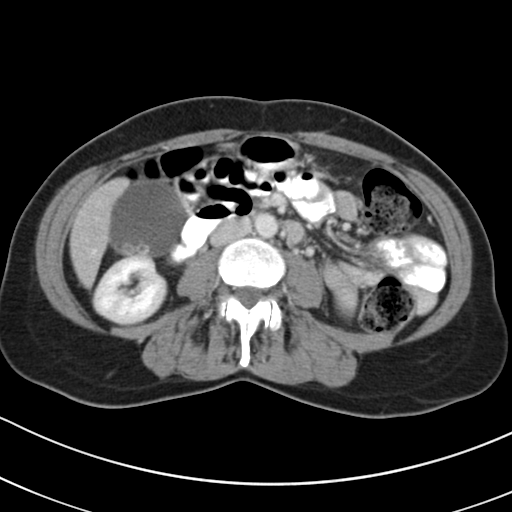
[im 51/81  soft-tissue]
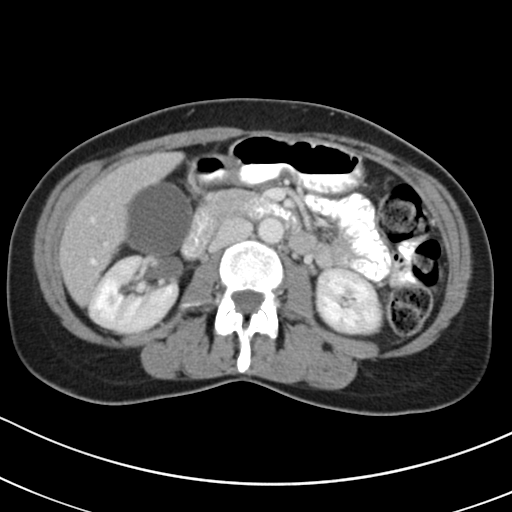
[im 51/81  bone]
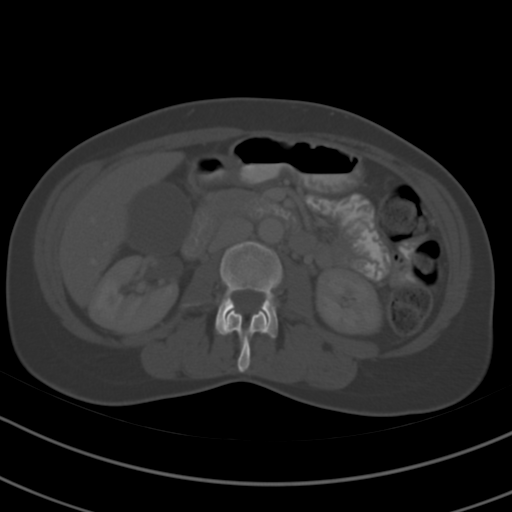
[im 59/81  soft-tissue]
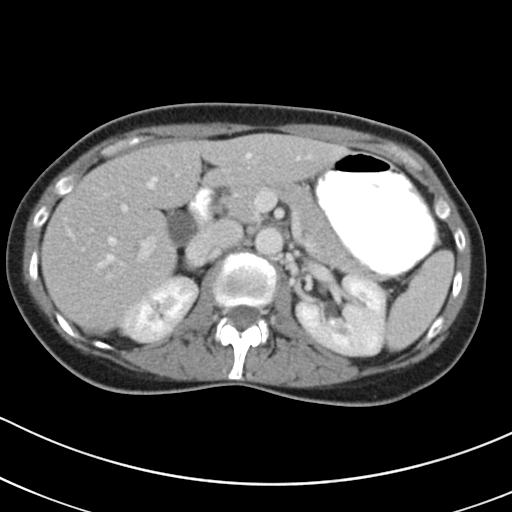
[im 64/81  soft-tissue]
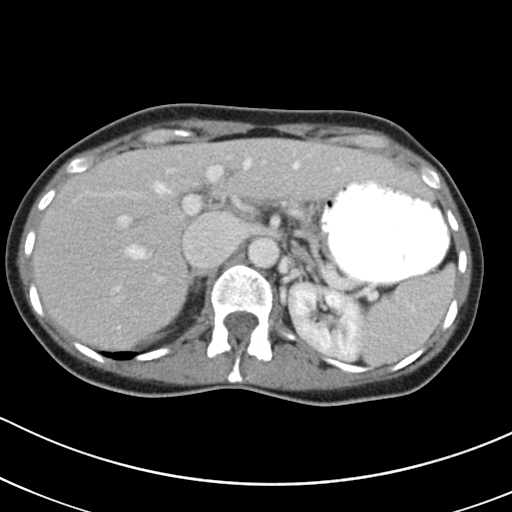
[im 64/81  lung]
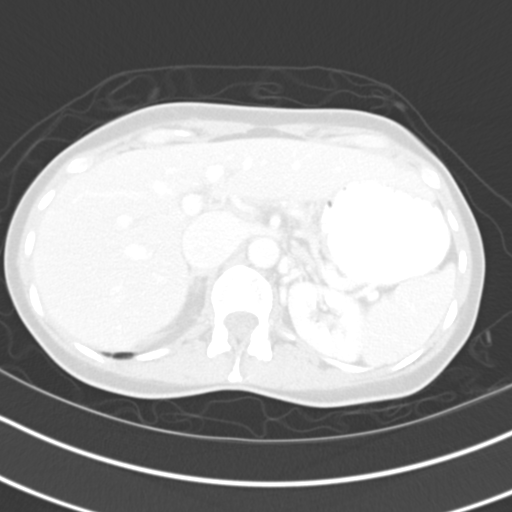
[im 68/81  soft-tissue]
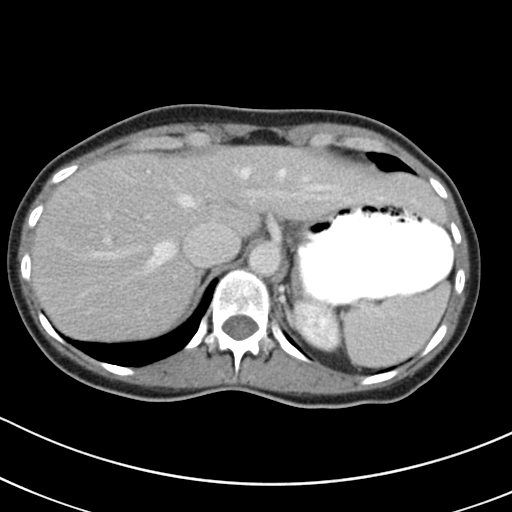
[im 68/81  lung]
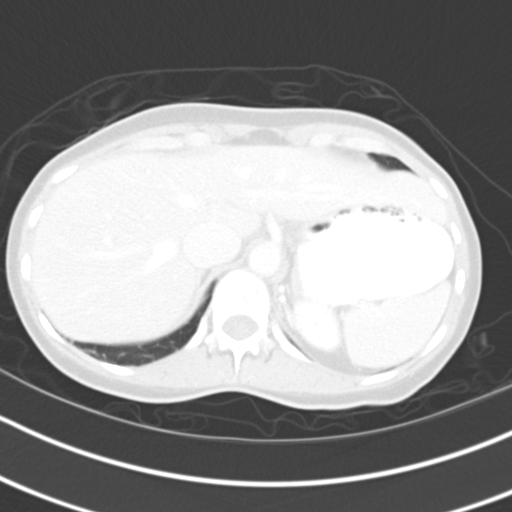
[im 72/81  lung]
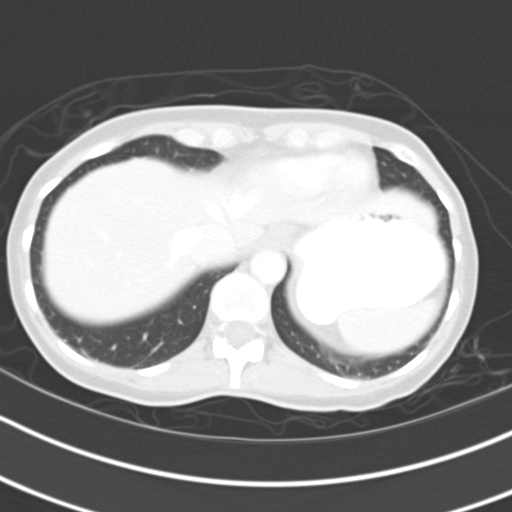
[im 76/81  soft-tissue]
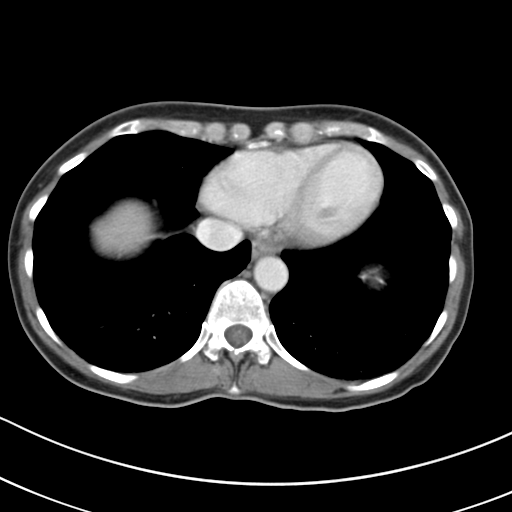
[im 76/81  lung]
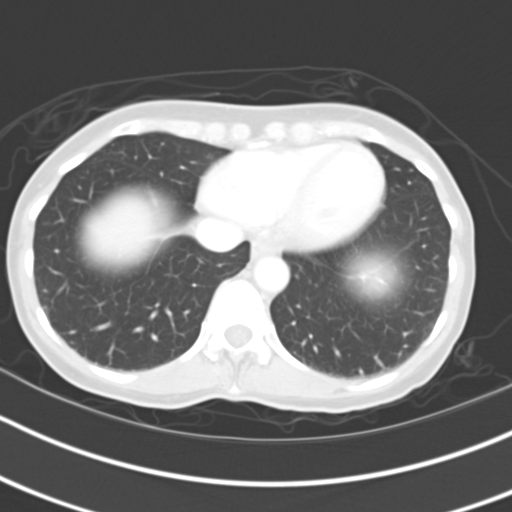

[14 of 32 positions shown; findings below may reference images not displayed]

FINDINGS: Lung bases are clear.  Heart size is normal.  Liver and
bile ducts are normal.  Multiple gallstones are present in the
gallbladder.  The gallbladder is distended without gallbladder wall
thickening.

The pancreas and spleen are normal.  The kidneys are normal without
obstruction or mass.  No renal calculi.

Negative for bowel obstruction.  The appendix is normal.  No bowel
thickening is present.  Negative for mass or adenopathy.  Negative
for lumbar fracture.
IMPRESSION: Gallstones.  No gallbladder wall thickening or biliary ductal
dilatation.

## 2015-05-10 DIAGNOSIS — N3000 Acute cystitis without hematuria: Secondary | ICD-10-CM | POA: Diagnosis not present

## 2015-05-11 ENCOUNTER — Encounter: Payer: Self-pay | Admitting: Family Medicine

## 2015-11-21 ENCOUNTER — Ambulatory Visit (INDEPENDENT_AMBULATORY_CARE_PROVIDER_SITE_OTHER): Payer: BLUE CROSS/BLUE SHIELD | Admitting: Family Medicine

## 2015-11-21 ENCOUNTER — Encounter: Payer: Self-pay | Admitting: Family Medicine

## 2015-11-21 VITALS — BP 112/70 | HR 72 | Temp 98.1°F | Resp 17 | Ht 65.0 in | Wt 127.0 lb

## 2015-11-21 DIAGNOSIS — Z0000 Encounter for general adult medical examination without abnormal findings: Secondary | ICD-10-CM

## 2015-11-21 LAB — CBC WITH DIFFERENTIAL/PLATELET
Basophils Absolute: 0 10*3/uL (ref 0.0–0.1)
Basophils Relative: 0.5 % (ref 0.0–3.0)
Eosinophils Absolute: 0.2 10*3/uL (ref 0.0–0.7)
Eosinophils Relative: 3.9 % (ref 0.0–5.0)
HCT: 37.6 % (ref 36.0–46.0)
Hemoglobin: 12.8 g/dL (ref 12.0–15.0)
Lymphocytes Relative: 34.9 % (ref 12.0–46.0)
Lymphs Abs: 1.7 10*3/uL (ref 0.7–4.0)
MCHC: 34 g/dL (ref 30.0–36.0)
MCV: 87.5 fl (ref 78.0–100.0)
Monocytes Absolute: 0.4 10*3/uL (ref 0.1–1.0)
Monocytes Relative: 8.9 % (ref 3.0–12.0)
Neutro Abs: 2.5 10*3/uL (ref 1.4–7.7)
Neutrophils Relative %: 51.8 % (ref 43.0–77.0)
Platelets: 256 10*3/uL (ref 150.0–400.0)
RBC: 4.3 Mil/uL (ref 3.87–5.11)
RDW: 13.6 % (ref 11.5–15.5)
WBC: 4.7 10*3/uL (ref 4.0–10.5)

## 2015-11-21 LAB — LIPID PANEL
Cholesterol: 206 mg/dL — ABNORMAL HIGH (ref 0–200)
HDL: 80.1 mg/dL (ref >39.00)
LDL Cholesterol: 112 mg/dL — ABNORMAL HIGH (ref 0–99)
NonHDL: 125.41
Total CHOL/HDL Ratio: 3
Triglycerides: 68 mg/dL (ref 0.0–149.0)
VLDL: 13.6 mg/dL (ref 0.0–40.0)

## 2015-11-21 LAB — HEPATIC FUNCTION PANEL
ALT: 24 U/L (ref 0–35)
AST: 22 U/L (ref 0–37)
Albumin: 4.4 g/dL (ref 3.5–5.2)
Alkaline Phosphatase: 44 U/L (ref 39–117)
Bilirubin, Direct: 0.2 mg/dL (ref 0.0–0.3)
Total Bilirubin: 1.1 mg/dL (ref 0.2–1.2)
Total Protein: 6.5 g/dL (ref 6.0–8.3)

## 2015-11-21 LAB — BASIC METABOLIC PANEL
BUN: 15 mg/dL (ref 6–23)
CO2: 32 mEq/L (ref 19–32)
Calcium: 9.3 mg/dL (ref 8.4–10.5)
Chloride: 103 mEq/L (ref 96–112)
Creatinine, Ser: 0.75 mg/dL (ref 0.40–1.20)
GFR: 84.54 mL/min (ref >60.00)
Glucose, Bld: 82 mg/dL (ref 70–99)
Potassium: 4.3 mEq/L (ref 3.5–5.1)
Sodium: 141 mEq/L (ref 135–145)

## 2015-11-21 LAB — TSH: TSH: 2.56 u[IU]/mL (ref 0.35–4.50)

## 2015-11-21 LAB — VITAMIN D 25 HYDROXY (VIT D DEFICIENCY, FRACTURES): VITD: 33.5 ng/mL (ref 30.00–100.00)

## 2015-11-21 NOTE — Progress Notes (Signed)
   Subjective:    Patient ID: Melanie Park, female    DOB: Jul 31, 1960, 55 y.o.   MRN: [id number]  HPI CPE- UTD on colonoscopy, mammo (due next month).  No need for pap due to hysterectomy.   Review of Systems Patient reports no vision/ hearing changes, adenopathy,fever, weight change,  persistant/recurrent hoarseness , swallowing issues, chest pain, palpitations, edema, persistant/recurrent cough, hemoptysis, dyspnea (rest/exertional/paroxysmal nocturnal), gastrointestinal bleeding (melena, rectal bleeding), abdominal pain, significant heartburn, bowel changes, GU symptoms (dysuria, hematuria, incontinence), Gyn symptoms (abnormal  bleeding, pain),  syncope, focal weakness, memory loss, numbness & tingling, skin/hair/nail changes, abnormal bruising or bleeding, anxiety, or depression.     Objective:   Physical Exam General Appearance:    Alert, cooperative, no distress, appears stated age  Head:    Normocephalic, without obvious abnormality, atraumatic  Eyes:    PERRL, conjunctiva/corneas clear, EOM's intact, fundi    benign, both eyes  Ears:    Normal TM's and external ear canals, both ears  Nose:   Nares normal, septum midline, mucosa normal, no drainage    or sinus tenderness  Throat:   Lips, mucosa, and tongue normal; teeth and gums normal  Neck:   Supple, symmetrical, trachea midline, no adenopathy;    Thyroid: no enlargement/tenderness/nodules  Back:     Symmetric, no curvature, ROM normal, no CVA tenderness  Lungs:     Clear to auscultation bilaterally, respirations unlabored  Chest Wall:    No tenderness or deformity   Heart:    Regular rate and rhythm, S1 and S2 normal, no murmur, rub   or gallop  Breast Exam:    Deferred to mammo  Abdomen:     Soft, non-tender, bowel sounds active all four quadrants,    no masses, no organomegaly  Genitalia:    Deferred to GYN  Rectal:    Extremities:   Extremities normal, atraumatic, no cyanosis or edema  Pulses:   2+ and symmetric all  extremities  Skin:   Skin color, texture, turgor normal, no rashes or lesions  Lymph nodes:   Cervical, supraclavicular, and axillary nodes normal  Neurologic:   CNII-XII intact, normal strength, sensation and reflexes    throughout          Assessment & Plan:

## 2015-11-21 NOTE — Assessment & Plan Note (Signed)
Pt's PE WNL.  UTD on Tdap, colonoscopy, mammo.  Check labs.  Anticipatory guidance provided.

## 2015-11-21 NOTE — Patient Instructions (Signed)
Follow up in 1 year or as needed We'll notify you of your lab results and make any changes if needed Continue to work on healthy diet and regular exercise- you look great! Try and drink more water Schedule your mammogram for next month Call with any questions or concerns Happy Holidays!!!

## 2015-11-21 NOTE — Progress Notes (Signed)
Pre visit review using our clinic review tool, if applicable. No additional management support is needed unless otherwise documented below in the visit note. 

## 2015-12-19 ENCOUNTER — Other Ambulatory Visit: Payer: Self-pay | Admitting: Family Medicine

## 2016-01-17 DIAGNOSIS — Z1231 Encounter for screening mammogram for malignant neoplasm of breast: Secondary | ICD-10-CM | POA: Diagnosis not present

## 2016-01-17 LAB — HM MAMMOGRAPHY

## 2016-01-19 ENCOUNTER — Encounter: Payer: Self-pay | Admitting: General Practice

## 2016-03-28 DIAGNOSIS — N3090 Cystitis, unspecified without hematuria: Secondary | ICD-10-CM | POA: Diagnosis not present

## 2016-10-27 ENCOUNTER — Other Ambulatory Visit: Payer: Self-pay | Admitting: Family Medicine

## 2016-11-12 ENCOUNTER — Other Ambulatory Visit: Payer: Self-pay | Admitting: General Practice

## 2016-11-12 MED ORDER — ALENDRONATE SODIUM 70 MG PO TABS: ORAL_TABLET | ORAL | 1 refills | Status: DC

## 2016-11-21 ENCOUNTER — Other Ambulatory Visit: Payer: Self-pay

## 2016-11-21 ENCOUNTER — Ambulatory Visit (INDEPENDENT_AMBULATORY_CARE_PROVIDER_SITE_OTHER): Payer: BLUE CROSS/BLUE SHIELD | Admitting: Family Medicine

## 2016-11-21 ENCOUNTER — Encounter: Payer: Self-pay | Admitting: Family Medicine

## 2016-11-21 VITALS — BP 110/76 | HR 77 | Temp 98.1°F | Resp 16 | Ht 65.0 in | Wt 126.4 lb

## 2016-11-21 DIAGNOSIS — R239 Unspecified skin changes: Secondary | ICD-10-CM

## 2016-11-21 DIAGNOSIS — Z0000 Encounter for general adult medical examination without abnormal findings: Secondary | ICD-10-CM

## 2016-11-21 DIAGNOSIS — M21612 Bunion of left foot: Secondary | ICD-10-CM | POA: Diagnosis not present

## 2016-11-21 DIAGNOSIS — M810 Age-related osteoporosis without current pathological fracture: Secondary | ICD-10-CM

## 2016-11-21 DIAGNOSIS — E785 Hyperlipidemia, unspecified: Secondary | ICD-10-CM | POA: Diagnosis not present

## 2016-11-21 LAB — BASIC METABOLIC PANEL
BUN: 19 mg/dL (ref 6–23)
CO2: 31 mEq/L (ref 19–32)
Calcium: 9.3 mg/dL (ref 8.4–10.5)
Chloride: 103 mEq/L (ref 96–112)
Creatinine, Ser: 0.75 mg/dL (ref 0.40–1.20)
GFR: 84.24 mL/min (ref >60.00)
Glucose, Bld: 93 mg/dL (ref 70–99)
Potassium: 4.2 mEq/L (ref 3.5–5.1)
Sodium: 141 mEq/L (ref 135–145)

## 2016-11-21 LAB — CBC WITH DIFFERENTIAL/PLATELET
Basophils Absolute: 0 10*3/uL (ref 0.0–0.1)
Basophils Relative: 0.6 % (ref 0.0–3.0)
Eosinophils Absolute: 0.2 10*3/uL (ref 0.0–0.7)
Eosinophils Relative: 2.6 % (ref 0.0–5.0)
HCT: 38.1 % (ref 36.0–46.0)
Hemoglobin: 12.6 g/dL (ref 12.0–15.0)
Lymphocytes Relative: 21.5 % (ref 12.0–46.0)
Lymphs Abs: 1.3 10*3/uL (ref 0.7–4.0)
MCHC: 33 g/dL (ref 30.0–36.0)
MCV: 89.6 fl (ref 78.0–100.0)
Monocytes Absolute: 0.6 10*3/uL (ref 0.1–1.0)
Monocytes Relative: 9.5 % (ref 3.0–12.0)
Neutro Abs: 3.9 10*3/uL (ref 1.4–7.7)
Neutrophils Relative %: 65.8 % (ref 43.0–77.0)
Platelets: 261 10*3/uL (ref 150.0–400.0)
RBC: 4.25 Mil/uL (ref 3.87–5.11)
RDW: 13.3 % (ref 11.5–15.5)
WBC: 5.9 10*3/uL (ref 4.0–10.5)

## 2016-11-21 LAB — HEPATIC FUNCTION PANEL
ALT: 44 U/L — ABNORMAL HIGH (ref 0–35)
AST: 38 U/L — ABNORMAL HIGH (ref 0–37)
Albumin: 4.2 g/dL (ref 3.5–5.2)
Alkaline Phosphatase: 54 U/L (ref 39–117)
Bilirubin, Direct: 0.2 mg/dL (ref 0.0–0.3)
Total Bilirubin: 1 mg/dL (ref 0.2–1.2)
Total Protein: 6.4 g/dL (ref 6.0–8.3)

## 2016-11-21 LAB — VITAMIN D 25 HYDROXY (VIT D DEFICIENCY, FRACTURES): VITD: 41.34 ng/mL (ref 30.00–100.00)

## 2016-11-21 LAB — LIPID PANEL
Cholesterol: 209 mg/dL — ABNORMAL HIGH (ref 0–200)
HDL: 78.4 mg/dL (ref >39.00)
LDL Cholesterol: 121 mg/dL — ABNORMAL HIGH (ref 0–99)
NonHDL: 130.82
Total CHOL/HDL Ratio: 3
Triglycerides: 51 mg/dL (ref 0.0–149.0)
VLDL: 10.2 mg/dL (ref 0.0–40.0)

## 2016-11-21 LAB — TSH: TSH: 2.27 u[IU]/mL (ref 0.35–4.50)

## 2016-11-21 NOTE — Patient Instructions (Signed)
Follow up in 1 year or as needed We'll notify you of your lab results and make any changes if needed Keep up the good work on healthy diet and regular exercise- you look great! Try OTC Replens to help w/ dryness We'll call you with your podiatry appt The order for the bone density is in and can be scheduled with your mammogram after Jan 10th Call with any questions or concerns Happy Thanksgiving!!!

## 2016-11-21 NOTE — Progress Notes (Signed)
   Subjective:    Patient ID: Jens SomEleanor A Gonterman, female    DOB: 03-Nov-1960, 56 y.o.   MRN: [id number]  HPI CPE- UTD on mammo, colonoscopy, no need for pap due to hysterectomy.  UTD on Tdap.  Declines flu.     Review of Systems Patient reports no vision/ hearing changes, adenopathy,fever, weight change,  persistant/recurrent hoarseness , swallowing issues, chest pain, palpitations, edema, persistant/recurrent cough, hemoptysis, dyspnea (rest/exertional/paroxysmal nocturnal), gastrointestinal bleeding (melena, rectal bleeding), abdominal pain, significant heartburn, bowel changes, GU symptoms (dysuria, hematuria, incontinence),  syncope, focal weakness, memory loss, numbness & tingling, skin/hair/nail changes, abnormal bruising or bleeding, anxiety, or depression.   L foot bunion + vaginal dryness    Objective:   Physical Exam General Appearance:    Alert, cooperative, no distress, appears stated age  Head:    Normocephalic, without obvious abnormality, atraumatic  Eyes:    PERRL, conjunctiva/corneas clear, EOM's intact, fundi    benign, both eyes  Ears:    Normal TM's and external ear canals, both ears  Nose:   Nares normal, septum midline, mucosa normal, no drainage    or sinus tenderness  Throat:   Lips, mucosa, and tongue normal; teeth and gums normal  Neck:   Supple, symmetrical, trachea midline, no adenopathy;    Thyroid: no enlargement/tenderness/nodules  Back:     Symmetric, no curvature, ROM normal, no CVA tenderness  Lungs:     Clear to auscultation bilaterally, respirations unlabored  Chest Wall:    No tenderness or deformity   Heart:    Regular rate and rhythm, S1 and S2 normal, no murmur, rub   or gallop  Breast Exam:    Deferred to mammo  Abdomen:     Soft, non-tender, bowel sounds active all four quadrants,    no masses, no organomegaly  Genitalia:    Deferred  Rectal:    Extremities:   Extremities normal, atraumatic, no cyanosis or edema  Pulses:   2+ and symmetric all  extremities  Skin:   Skin color, texture, turgor normal, no rashes or lesions  Lymph nodes:   Cervical, supraclavicular, and axillary nodes normal  Neurologic:   CNII-XII intact, normal strength, sensation and reflexes    throughout          Assessment & Plan:

## 2016-11-21 NOTE — Assessment & Plan Note (Signed)
Pt's PE WNL.  UTD on colonoscopy, mammo.  Due for repeat DEXA (order entered).  Declines flu shot.  Check labs.  Anticipatory guidance provided.

## 2016-11-21 NOTE — Assessment & Plan Note (Signed)
Repeat DEXA, check Vit D level.  Replete prn.

## 2016-11-21 NOTE — Addendum Note (Signed)
Addended by: Sheliah HatchABORI, Deija Buhrman E on: 11/21/2016 08:40 AM   Modules accepted: Orders

## 2016-11-22 ENCOUNTER — Other Ambulatory Visit: Payer: Self-pay | Admitting: Family Medicine

## 2016-11-22 DIAGNOSIS — R7989 Other specified abnormal findings of blood chemistry: Secondary | ICD-10-CM

## 2016-11-22 DIAGNOSIS — R945 Abnormal results of liver function studies: Secondary | ICD-10-CM

## 2016-12-02 ENCOUNTER — Other Ambulatory Visit: Payer: Self-pay

## 2016-12-02 ENCOUNTER — Ambulatory Visit: Payer: BLUE CROSS/BLUE SHIELD | Admitting: Family Medicine

## 2016-12-02 ENCOUNTER — Encounter: Payer: Self-pay | Admitting: Family Medicine

## 2016-12-02 VITALS — BP 121/81 | HR 88 | Temp 98.9°F | Resp 17 | Ht 65.0 in | Wt 131.1 lb

## 2016-12-02 DIAGNOSIS — H66001 Acute suppurative otitis media without spontaneous rupture of ear drum, right ear: Secondary | ICD-10-CM | POA: Diagnosis not present

## 2016-12-02 MED ORDER — AMOXICILLIN 875 MG PO TABS: 875.0000 mg | ORAL_TABLET | Freq: Two times a day (BID) | ORAL | 0 refills | Status: DC

## 2016-12-02 MED ORDER — PROMETHAZINE-DM 6.25-15 MG/5ML PO SYRP: 5.0000 mL | ORAL_SOLUTION | Freq: Four times a day (QID) | ORAL | 0 refills | Status: DC | PRN

## 2016-12-02 NOTE — Patient Instructions (Signed)
Follow up as needed or as scheduled START the Amoxicillin twice daily- take w/ food Drink plenty of fluids Ibuprofen as needed for pain/fever Use the cough syrup as needed- may cause drowsiness Mucinex DM for daytime cough relief w/out drowsiness REST! Call with any questions or concerns Hang in there!!

## 2016-12-02 NOTE — Progress Notes (Signed)
   Subjective:    Patient ID: Melanie Park, female    DOB: 1960-01-28, 56 y.o.   MRN: [id number]  HPI URI- sxs started 11/20 w/ sore throat.  Next day was dizzy and fatigued.  + cough- non-productive.  Bilateral ear pain w/ drainage from R ear last night.  No fevers.  Denies sinus pain/pressure.  Sore throat has resolved.  + sick contacts.  No N/V.  No body aches.   Review of Systems For ROS see HPI     Objective:   Physical Exam  Constitutional: She appears well-developed and well-nourished. No distress.  HENT:  Head: Normocephalic and atraumatic.  Right Ear: Tympanic membrane is injected, erythematous and bulging. A middle ear effusion is present.  Left Ear: Tympanic membrane normal.  Nose: Mucosal edema and rhinorrhea present. Right sinus exhibits no maxillary sinus tenderness and no frontal sinus tenderness. Left sinus exhibits no maxillary sinus tenderness and no frontal sinus tenderness.  Mouth/Throat: Mucous membranes are normal. Posterior oropharyngeal erythema (w/ PND) present.  Eyes: Conjunctivae and EOM are normal. Pupils are equal, round, and reactive to light.  Neck: Normal range of motion. Neck supple.  Cardiovascular: Normal rate, regular rhythm and normal heart sounds.  Pulmonary/Chest: Effort normal and breath sounds normal. No respiratory distress. She has no wheezes. She has no rales.  Lymphadenopathy:    She has no cervical adenopathy.  Vitals reviewed.         Assessment & Plan:  R OM- new.  Pt's sxs and PE consistent w/ infxn.  Start abx for ear infection and cough syrup to relieve symptoms.  Reviewed supportive care and red flags that should prompt return.  Pt expressed understanding and is in agreement w/ plan.

## 2016-12-09 ENCOUNTER — Ambulatory Visit: Payer: BLUE CROSS/BLUE SHIELD | Admitting: Podiatry

## 2016-12-09 ENCOUNTER — Telehealth: Payer: Self-pay | Admitting: Family Medicine

## 2016-12-09 NOTE — Telephone Encounter (Signed)
Please advise pt was seen for ear pain.

## 2016-12-09 NOTE — Telephone Encounter (Signed)
Spoke with pt, she advised that she does not have much pain. She says that both ears are clogged, she is having a hard time hearing out of her ears and she is hearing "tinny sounds", she is not coughing as much. Pt denies any facial pain or headaches.

## 2016-12-09 NOTE — Telephone Encounter (Signed)
Copied from CRM [phone]. Topic: Quick Communication - See Telephone Encounter >> Dec 09, 2016  9:29 AM Herby AbrahamJohnson, Shiquita C wrote: CRM for notification. See Telephone encounter for:  12/09/16.   Pt says that she was seen and prescribed a medication. Pt says that she is still not feeling better. Pt would like to be advised further.    CB # provided.

## 2016-12-09 NOTE — Telephone Encounter (Signed)
Pt was dx'd w/ a R ear infection on 11/26 and started on Amox.  Is it still the R ear that's hurting or is it now the L ear?  Any more information would help with the next steps

## 2016-12-09 NOTE — Telephone Encounter (Signed)
Patient notified of PCP recommendations and is agreement and expresses an understanding.   Ok for PEC to Discuss results / PCP recommendations / Schedule patient.   

## 2016-12-09 NOTE — Telephone Encounter (Signed)
Finish abx as directed.  Given her ear congestion, start OTC decongestant (phenylephrine or sudafed) OR Claritin or Zyrtec D to help open up her ears

## 2016-12-12 ENCOUNTER — Ambulatory Visit (INDEPENDENT_AMBULATORY_CARE_PROVIDER_SITE_OTHER): Payer: BLUE CROSS/BLUE SHIELD | Admitting: Podiatry

## 2016-12-12 ENCOUNTER — Ambulatory Visit (INDEPENDENT_AMBULATORY_CARE_PROVIDER_SITE_OTHER): Payer: BLUE CROSS/BLUE SHIELD

## 2016-12-12 DIAGNOSIS — M7752 Other enthesopathy of left foot: Secondary | ICD-10-CM | POA: Diagnosis not present

## 2016-12-12 DIAGNOSIS — M2012 Hallux valgus (acquired), left foot: Secondary | ICD-10-CM | POA: Diagnosis not present

## 2016-12-12 DIAGNOSIS — M778 Other enthesopathies, not elsewhere classified: Secondary | ICD-10-CM

## 2016-12-12 DIAGNOSIS — M779 Enthesopathy, unspecified: Secondary | ICD-10-CM

## 2016-12-12 NOTE — Progress Notes (Signed)
Subjective:  Patient ID: Melanie Park, female    DOB: [birth year],  MRN: [id number] HPI Chief Complaint  Patient presents with  . Bunions    Patient reports today with (L) ft bunion thats affecting her 3rd toe x years. Painful depending on foot wear.     56 y.o. female presents with the above complaint.     Past Medical History:  Diagnosis Date  . Diverticulitis   . Frequent headaches   . History of chicken pox   . Inflammatory polyps of colon (HCC)   . Migraine   . UTI (urinary tract infection)    Past Surgical History:  Procedure Laterality Date  . ABDOMINAL HYSTERECTOMY      Current Outpatient Medications:  .  alendronate (FOSAMAX) 70 MG tablet, TAKE 1 TABLET BY MOUTH EVERY 7 DAYS (TAKE WITH FULL GLASS OF WATER ON EMPTY STOMACH), Disp: 12 tablet, Rfl: 1 .  amoxicillin (AMOXIL) 875 MG tablet, Take 1 tablet (875 mg total) by mouth 2 (two) times daily., Disp: 20 tablet, Rfl: 0 .  calcium carbonate (TUMS - DOSED IN MG ELEMENTAL CALCIUM) 500 MG chewable tablet, Chew 1 tablet by mouth daily., Disp: , Rfl:  .  Multiple Vitamin (MULTIVITAMIN WITH MINERALS) TABS, Take 1 tablet by mouth daily., Disp: , Rfl:  .  promethazine-dextromethorphan (PROMETHAZINE-DM) 6.25-15 MG/5ML syrup, Take 5 mLs by mouth 4 (four) times daily as needed. (Patient not taking: Reported on 12/12/2016), Disp: 240 mL, Rfl: 0  Allergies  Allergen Reactions  . Codeine Other (See Comments)    headaches  . Morphine And Related     Crashed BP    Review of Systems  All other systems reviewed and are negative.  Objective:  There were no vitals filed for this visit.  General: Well developed, nourished, in no acute distress, alert and oriented x3   Dermatological: Skin is warm, dry and supple bilateral. Nails x 10 are well maintained; remaining integument appears unremarkable at this time. There are no open sores, no preulcerative lesions, no rash or signs of infection present.  Vascular: Dorsalis Pedis  artery and Posterior Tibial artery pedal pulses are 2/4 bilateral with immedate capillary fill time. Pedal hair growth present. No varicosities and no lower extremity edema present bilateral.   Neruologic: Grossly intact via light touch bilateral. Vibratory intact via tuning fork bilateral. Protective threshold with Semmes Wienstein monofilament intact to all pedal sites bilateral. Patellar and Achilles deep tendon reflexes 2+ bilateral. No Babinski or clonus noted bilateral.   Musculoskeletal: No gross boney pedal deformities bilateral. No pain, crepitus, or limitation noted with foot and ankle range of motion bilateral. Muscular strength 5/5 in all groups tested bilateral.  She has pain on range of motion of the first metatarsophalangeal joint of the left foot with some mild crepitation.  The toe is easily reducible and with some hypermobility of the first metatarsal.  Hammertoe deformity mallet toe deformity with lateral deviation of the distal phalanx second and third are easily reducible as well.  There is some erythema to the third digit along the medial aspect at the level of the DIPJ more than likely some inflammatory arthritis.  Gait: Unassisted, Nonantalgic.    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  Dislocation of the first metatarsophalangeal joint of the left foot is present.  Tibial sesamoid position is approximately 5 fibular sesamoid is sitting way out of the interspace.  Hypertrophic medial condyle to the  head of the first metatarsal is present.  She has a dislocation of the DIPJ of the second toe and third toe of the left foot.  The left toe is mildly edematous.  Assessment & Plan:   Assessment: Hallux abductovalgus deformity left foot.  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  Inflammation reactive third digit left.  Plan: We will follow up with her in March for surgical consult.   I injected the third toe today with dexamethasone and local anesthetic.  She will notify me with questions or concerns.     Max T. KleinHyatt, [place] Dakota[place]PM

## 2017-01-17 DIAGNOSIS — Z1231 Encounter for screening mammogram for malignant neoplasm of breast: Secondary | ICD-10-CM | POA: Diagnosis not present

## 2017-01-17 DIAGNOSIS — M810 Age-related osteoporosis without current pathological fracture: Secondary | ICD-10-CM | POA: Diagnosis not present

## 2017-01-17 LAB — HM MAMMOGRAPHY

## 2017-01-17 LAB — HM DEXA SCAN

## 2017-01-21 ENCOUNTER — Encounter: Payer: Self-pay | Admitting: General Practice

## 2017-02-11 DIAGNOSIS — D485 Neoplasm of uncertain behavior of skin: Secondary | ICD-10-CM | POA: Diagnosis not present

## 2017-02-11 DIAGNOSIS — L738 Other specified follicular disorders: Secondary | ICD-10-CM | POA: Diagnosis not present

## 2017-02-11 DIAGNOSIS — D239 Other benign neoplasm of skin, unspecified: Secondary | ICD-10-CM | POA: Diagnosis not present

## 2017-02-20 ENCOUNTER — Encounter: Payer: Self-pay | Admitting: Podiatry

## 2017-02-20 ENCOUNTER — Ambulatory Visit (INDEPENDENT_AMBULATORY_CARE_PROVIDER_SITE_OTHER): Payer: BLUE CROSS/BLUE SHIELD | Admitting: Podiatry

## 2017-02-20 DIAGNOSIS — M2042 Other hammer toe(s) (acquired), left foot: Secondary | ICD-10-CM | POA: Diagnosis not present

## 2017-02-20 DIAGNOSIS — M2012 Hallux valgus (acquired), left foot: Secondary | ICD-10-CM

## 2017-02-20 NOTE — Patient Instructions (Signed)
Pre-Operative Instructions  Congratulations, you have decided to take an important step towards improving your quality of life.  You can be assured that the doctors and staff at [facility] will be with you every step of the way.  Here are some important things you should know:  1. Plan to be at the surgery center/hospital at least 1 (one) hour prior to your scheduled time, unless otherwise directed by the surgical center/hospital staff.  You must have a responsible adult accompany you, remain during the surgery and drive you home.  Make sure you have directions to the surgical center/hospital to ensure you arrive on time. 2. If you are having surgery at [facility] or [facility] hospitals, you will need a copy of your medical history and physical form from your family physician within one month prior to the date of surgery. We will give you a form for your primary physician to complete.  3. We make every effort to accommodate the date you request for surgery.  However, there are times where surgery dates or times have to be moved.  We will contact you as soon as possible if a change in schedule is required.   4. No aspirin/ibuprofen for one week before surgery.  If you are on aspirin, any non-steroidal anti-inflammatory medications (Mobic, Aleve, Ibuprofen) should not be taken seven (7) days prior to your surgery.  You make take Tylenol for pain prior to surgery.  5. Medications - If you are taking daily heart and blood pressure medications, seizure, reflux, allergy, asthma, anxiety, pain or diabetes medications, make sure you notify the surgery center/hospital before the day of surgery so they can tell you which medications you should take or avoid the day of surgery. 6. No food or drink after midnight the night before surgery unless directed otherwise by surgical center/hospital staff. 7. No alcoholic beverages 24-hours prior to surgery.  No smoking 24-hours prior or 24-hours after  surgery. 8. Wear loose pants or shorts. They should be loose enough to fit over bandages, boots, and casts. 9. Don't wear slip-on shoes. Sneakers are preferred. 10. Bring your boot with you to the surgery center/hospital.  Also bring crutches or a walker if your physician has prescribed it for you.  If you do not have this equipment, it will be provided for you after surgery. 11. If you have not been contacted by the surgery center/hospital by the day before your surgery, call to confirm the date and time of your surgery. 12. Leave-time from work may vary depending on the type of surgery you have.  Appropriate arrangements should be made prior to surgery with your employer. 13. Prescriptions will be provided immediately following surgery by your doctor.  Fill these as soon as possible after surgery and take the medication as directed. Pain medications will not be refilled on weekends and must be approved by the doctor. 14. Remove nail polish on the operative foot and avoid getting pedicures prior to surgery. 15. Wash the night before surgery.  The night before surgery wash the foot and leg well with water and the antibacterial soap provided. Be sure to pay special attention to beneath the toenails and in between the toes.  Wash for at least three (3) minutes. Rinse thoroughly with water and dry well with a towel.  Perform this wash unless told not to do so by your physician.  Enclosed: 1 Ice pack (please put in freezer the night before surgery)   1 Hibiclens skin cleaner     Pre-op instructions  If you have any questions regarding the instructions, please do not hesitate to call our office.  [place]: [address], [place], [place] [address] -- [phone]  [place]: [address] , [place] [address] -- [phone]  [place]: [address]  [place], [place] [address] -- [phone]  [place]: [address], [address], [place], [place] [address] -- [phone]  Website:  [email or web address] 

## 2017-02-22 NOTE — Progress Notes (Signed)
She presents today for surgical consult regarding severe bunion deformity to her left foot as well as a hammertoe deformity.  She states that is beginning to affect her ability to perform her daily activities.  She denies any changes in her past medical history medications allergy surgeries and social history.  Objective: Vital signs are stable alert and oriented x3.  I have reviewed her past medical history medications allergy surgeries and social history with her once again today.  Appears to be in no distress.  Pulses remain palpable and strong.  Neurologic sensorium is intact deep tendon reflexes are intact muscle strength +5/5 dorsiflexors plantar flexors inverters everters all intrinsic musculature is intact.  Orthopedic evaluation demonstrates severe hallux valgus deformity with hypertrophic and hypermobile first metatarsal medial cuneiform joint and hallux abductus angle greater than normal value with dislocation.  Dorsal dislocation of the second toe at the metatarsal phalangeal joint with mild medial deviation is also noted.  I reviewed radiographs today prep taken previously indicating severe hallux valgus deformity.  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  Hammertoe deformity second left.  Plan: Discussed etiology pathology conservative versus surgical therapies.  At this point we discussed surgical intervention consisting of a Lapidus procedure for bunion reduction as well as a second metatarsal osteotomy and hammertoe repair.  She understands that she will be casted after this.  She will understands that this is a nonweightbearing status for several weeks.  We did discuss the possible postop complications which may include but are not limited to postop pain bleeding swelling infection recurrence need for further surgery overcorrection under correction loss of digit loss of limb loss of  life dropfoot etc.  I answered all the questions regarding these procedures to the best of my ability in layman's terms and I will follow-up with her in the near future for surgical intervention.

## 2017-03-03 DIAGNOSIS — C44519 Basal cell carcinoma of skin of other part of trunk: Secondary | ICD-10-CM | POA: Diagnosis not present

## 2017-03-19 ENCOUNTER — Other Ambulatory Visit: Payer: Self-pay | Admitting: Podiatry

## 2017-03-19 MED ORDER — PROMETHAZINE HCL 25 MG PO TABS: 25.0000 mg | ORAL_TABLET | Freq: Three times a day (TID) | ORAL | 0 refills | Status: DC | PRN

## 2017-03-19 MED ORDER — HYDROMORPHONE HCL 4 MG PO TABS: 4.0000 mg | ORAL_TABLET | ORAL | 0 refills | Status: DC | PRN

## 2017-03-19 MED ORDER — CEPHALEXIN 500 MG PO CAPS: 500.0000 mg | ORAL_CAPSULE | Freq: Three times a day (TID) | ORAL | 0 refills | Status: DC

## 2017-03-21 ENCOUNTER — Encounter: Payer: Self-pay | Admitting: Podiatry

## 2017-03-21 DIAGNOSIS — M25572 Pain in left ankle and joints of left foot: Secondary | ICD-10-CM | POA: Diagnosis not present

## 2017-03-21 DIAGNOSIS — Z01818 Encounter for other preprocedural examination: Secondary | ICD-10-CM | POA: Diagnosis not present

## 2017-03-21 DIAGNOSIS — M2042 Other hammer toe(s) (acquired), left foot: Secondary | ICD-10-CM | POA: Diagnosis not present

## 2017-03-21 DIAGNOSIS — M2012 Hallux valgus (acquired), left foot: Secondary | ICD-10-CM | POA: Diagnosis not present

## 2017-03-21 DIAGNOSIS — M21612 Bunion of left foot: Secondary | ICD-10-CM | POA: Diagnosis not present

## 2017-03-24 ENCOUNTER — Telehealth: Payer: Self-pay | Admitting: *Deleted

## 2017-03-24 NOTE — Telephone Encounter (Signed)
POST OP CALL-    1) General condition stated by the patient: Okay  2) Is the pt having pain? Some  3) Pain score: 5-6 on Saturday, but that has been the worst so far  4) Has the pt taken Rx'd medication? Only 2 doses, taking Ibuprofen at other times  5) Is the pain medication giving relief? Yes  6) Any fever, chills, nausea, or vomiting? No  7) Any shortness of breath or tightness in the calf? No  8) Is the bandages clean, dry and intact? (Patient is in a cast)  9) Is the bandage excessively tight? Cast seems to be comfortable  10) Is there excessive bleeding or drainage coming through the bandage? (Patient in a cast)  11) Did you understand all of the post op instruction sheet given? Yes  12) Any questions or concerns regarding post op care/recovery? No   Confirmed POV appointment with patient

## 2017-03-27 ENCOUNTER — Ambulatory Visit (INDEPENDENT_AMBULATORY_CARE_PROVIDER_SITE_OTHER): Payer: BLUE CROSS/BLUE SHIELD

## 2017-03-27 ENCOUNTER — Encounter: Payer: Self-pay | Admitting: Podiatry

## 2017-03-27 ENCOUNTER — Ambulatory Visit (INDEPENDENT_AMBULATORY_CARE_PROVIDER_SITE_OTHER): Payer: BLUE CROSS/BLUE SHIELD | Admitting: Podiatry

## 2017-03-27 VITALS — BP 124/74 | HR 74 | Temp 97.0°F

## 2017-03-27 DIAGNOSIS — M2012 Hallux valgus (acquired), left foot: Secondary | ICD-10-CM | POA: Diagnosis not present

## 2017-03-27 DIAGNOSIS — M2042 Other hammer toe(s) (acquired), left foot: Secondary | ICD-10-CM | POA: Diagnosis not present

## 2017-03-27 NOTE — Progress Notes (Signed)
She presents today for follow-up of her Lapidus procedure hammertoe repair #2 #3 of the left foot.  Date of surgery 03/21/2017.  She denies fever chills nausea vomiting muscle aches and pains.  She states that she had some shooting burning pains in her toes but has since resolved..  Objective: Vital signs are stable she is alert and oriented x3.  Cast is intact she is ambulating with crutches.  She has good range of motion of the toes and normal sensation.  I see no signs of infection the cast is loose proximally and she has good motion of the toe without pain.  Assessment: Well-healing Lapidus procedure.  Plan: At this point I encouraged her to continue all conservative therapies keeping the leg elevated as much as possible that we will allow her to return to work as long as she can keep that leg elevated.  She understands this and is amenable to it.  Also provided her with a handicap information.

## 2017-04-03 ENCOUNTER — Ambulatory Visit (INDEPENDENT_AMBULATORY_CARE_PROVIDER_SITE_OTHER): Payer: BLUE CROSS/BLUE SHIELD | Admitting: Podiatry

## 2017-04-03 VITALS — BP 128/72 | HR 78 | Temp 98.0°F

## 2017-04-03 DIAGNOSIS — M2042 Other hammer toe(s) (acquired), left foot: Secondary | ICD-10-CM

## 2017-04-03 DIAGNOSIS — M2012 Hallux valgus (acquired), left foot: Secondary | ICD-10-CM

## 2017-04-05 NOTE — Progress Notes (Signed)
She presents today for her second postop visit status post Lapidus procedure and hammertoe repair #2 #3 of the left foot with screws.  Date of surgery March 21, 2017.  She denies fever chills nausea vomiting muscle aches and pains states that her foot feels fine.  She denies calf pain chest pain shortness of breath.  Objective: She presents today ambulating nonweightbearing using crutches cast is intact to the left lower extremity once removed demonstrates dry sterile dressing intact clean no bleeding.  Pulses are palpable surgical site is looking very good there is no dehiscence no erythema no cellulitis drainage or odor very little edema at all.  Good range of motion of the toes.  Assessment: Well-healing surgical foot.  Plan: Redressed today dresser compressive dressing sutures were not removed and a dry sterile underlayment with a new cast application.  She will continue nonweightbearing status and I will follow-up with her in 2 weeks.  She will call with questions or concerns.

## 2017-04-17 ENCOUNTER — Ambulatory Visit (INDEPENDENT_AMBULATORY_CARE_PROVIDER_SITE_OTHER): Payer: BLUE CROSS/BLUE SHIELD | Admitting: Podiatry

## 2017-04-17 ENCOUNTER — Ambulatory Visit (INDEPENDENT_AMBULATORY_CARE_PROVIDER_SITE_OTHER): Payer: BLUE CROSS/BLUE SHIELD

## 2017-04-17 DIAGNOSIS — M2042 Other hammer toe(s) (acquired), left foot: Secondary | ICD-10-CM | POA: Diagnosis not present

## 2017-04-17 DIAGNOSIS — M2012 Hallux valgus (acquired), left foot: Secondary | ICD-10-CM

## 2017-04-19 NOTE — Progress Notes (Signed)
She presents today for follow-up of her Lapidus procedure left foot.  She states I am ready to get this cast off.  Denies fever chills nausea vomiting muscle aches pains calf pain chest pain shortness of breath.  Objective: Vital signs are stable alert and oriented x3.  Pulses are palpable.  Cast was removed no open lesions or wounds all of her sutures were removed her incisions are healing very well her foot is rectus toes are rectus.  The foot looks absolutely textbook.  She does have some stiffness and she has not been moving the toes but this should go on and get better over the next couple of weeks.  Assessment: Well-healing Lapidus left.  Plan: At this point I will allow her to start getting this wet but still no weightbearing.  She will be placed in a Cam walker today and I will follow-up with her in a couple of weeks for another set of x-rays.

## 2017-05-06 ENCOUNTER — Encounter: Payer: Self-pay | Admitting: Podiatry

## 2017-05-06 ENCOUNTER — Ambulatory Visit (INDEPENDENT_AMBULATORY_CARE_PROVIDER_SITE_OTHER): Payer: BLUE CROSS/BLUE SHIELD | Admitting: Podiatry

## 2017-05-06 ENCOUNTER — Ambulatory Visit (INDEPENDENT_AMBULATORY_CARE_PROVIDER_SITE_OTHER): Payer: BLUE CROSS/BLUE SHIELD

## 2017-05-06 DIAGNOSIS — M2012 Hallux valgus (acquired), left foot: Secondary | ICD-10-CM

## 2017-05-06 DIAGNOSIS — M2042 Other hammer toe(s) (acquired), left foot: Secondary | ICD-10-CM

## 2017-05-06 NOTE — Progress Notes (Signed)
She presents today for another postop visit date of surgery March 21, 2017 status post Lapidus procedure with bunionectomy left foot hammertoe repair with screws #2 #3 of the left foot.  She states that she is 6 weeks out now and has been nonweightbearing and very compliant.  She denies fever chills nausea vomiting states that she is doing quite well.  Objective: Vital signs are stable she is alert and oriented x3 boot was removed demonstrates minimal edema no erythema cellulitis drainage or odor she has good range of motion of the first metatarsophalangeal joint there is some restriction on plantarflexion but I think he just needs stretching.  Radiographs taken today demonstrate well healing arthrodesis of the first metatarsal medial cuneiform joint as well as the PIPJ's of the second and third digits left foot.  Assessment: Well-healing surgical foot left.  Plan: At this point we will allow her to start ambulating using her cam walker and she will continue to do so for the next 2 weeks.  I will follow-up with her in 2 weeks at which time we hope to get back into regular shoe gear.

## 2017-05-08 DIAGNOSIS — Z85828 Personal history of other malignant neoplasm of skin: Secondary | ICD-10-CM | POA: Diagnosis not present

## 2017-05-08 DIAGNOSIS — D1801 Hemangioma of skin and subcutaneous tissue: Secondary | ICD-10-CM | POA: Diagnosis not present

## 2017-05-08 DIAGNOSIS — D485 Neoplasm of uncertain behavior of skin: Secondary | ICD-10-CM | POA: Diagnosis not present

## 2017-05-08 DIAGNOSIS — L814 Other melanin hyperpigmentation: Secondary | ICD-10-CM | POA: Diagnosis not present

## 2017-05-08 DIAGNOSIS — L821 Other seborrheic keratosis: Secondary | ICD-10-CM | POA: Diagnosis not present

## 2017-05-15 ENCOUNTER — Other Ambulatory Visit: Payer: Self-pay | Admitting: Family Medicine

## 2017-05-20 ENCOUNTER — Encounter: Payer: Self-pay | Admitting: Podiatry

## 2017-05-20 ENCOUNTER — Ambulatory Visit (INDEPENDENT_AMBULATORY_CARE_PROVIDER_SITE_OTHER): Payer: BLUE CROSS/BLUE SHIELD

## 2017-05-20 ENCOUNTER — Ambulatory Visit (INDEPENDENT_AMBULATORY_CARE_PROVIDER_SITE_OTHER): Payer: BLUE CROSS/BLUE SHIELD | Admitting: Podiatry

## 2017-05-20 DIAGNOSIS — M2042 Other hammer toe(s) (acquired), left foot: Secondary | ICD-10-CM | POA: Diagnosis not present

## 2017-05-20 DIAGNOSIS — Z9889 Other specified postprocedural states: Secondary | ICD-10-CM

## 2017-05-20 DIAGNOSIS — M2012 Hallux valgus (acquired), left foot: Secondary | ICD-10-CM

## 2017-05-20 NOTE — Progress Notes (Signed)
She presents today for follow-up of a Lapidus procedure and hammertoe repair #2 #3 of the left foot.  Date of surgery 03/21/2017.  States that she is doing very well not much pain or discomfort.  Just a little swelling at times time and wears her compression sock which does help.  She states that she really has no concerns.  Objective: Vital signs are stable she is alert oriented x3.  Presents today ambulating with her Cam walker.  Ambulates without an antalgic gait.  Evaluation of the foot demonstrates no edema no erythema cellulitis drainage or odor incision sites appear to be healing very nicely she has great range of motion of the lesser metatarsophalangeal joints #2 #3 she does have some stiffness of the first metatarsophalangeal joint left.  Radiographs taken today do demonstrate well-healing Lapidus procedure and hammertoe repairs with screws.  Assessment: Well-healing surgical foot.  Plan: I want her to slowly come out of the cam walker over the next couple of weeks and start walking in her tennis shoes and then her dress shoes.  I will follow-up with her in 1 month at which time we will discuss whether or not she needs physical therapy.

## 2017-06-24 ENCOUNTER — Encounter: Payer: Self-pay | Admitting: Podiatry

## 2017-06-24 ENCOUNTER — Ambulatory Visit (INDEPENDENT_AMBULATORY_CARE_PROVIDER_SITE_OTHER): Payer: BLUE CROSS/BLUE SHIELD | Admitting: Podiatry

## 2017-06-24 ENCOUNTER — Ambulatory Visit (INDEPENDENT_AMBULATORY_CARE_PROVIDER_SITE_OTHER): Payer: BLUE CROSS/BLUE SHIELD

## 2017-06-24 DIAGNOSIS — M2042 Other hammer toe(s) (acquired), left foot: Secondary | ICD-10-CM | POA: Diagnosis not present

## 2017-06-24 DIAGNOSIS — M2012 Hallux valgus (acquired), left foot: Secondary | ICD-10-CM

## 2017-06-24 DIAGNOSIS — Z9889 Other specified postprocedural states: Secondary | ICD-10-CM

## 2017-06-24 NOTE — Progress Notes (Signed)
She presents today 3 months status post Lapidus procedure bunionectomy hammertoe repair #2 #3 of the left foot she states that she is doing very well except for the swelling she is ready for the swelling to go away.  Objective: Vital signs stable she is alert and oriented x3 there is no erythematous mild edema no cellulitis drainage or odor she has good range of motion of the first metatarsophalangeal joint though it is not full but it seems to be progressively improving from visit to visit.  Radiographs taken today demonstrate consolidation of arthrodesis sites.  Assessment well-healing surgical foot.  Plan: Will allow her to get back to her full routine I will follow-up with her on an as-needed basis.

## 2017-07-24 ENCOUNTER — Ambulatory Visit (INDEPENDENT_AMBULATORY_CARE_PROVIDER_SITE_OTHER): Payer: BLUE CROSS/BLUE SHIELD | Admitting: Podiatry

## 2017-07-24 ENCOUNTER — Ambulatory Visit (INDEPENDENT_AMBULATORY_CARE_PROVIDER_SITE_OTHER): Payer: BLUE CROSS/BLUE SHIELD

## 2017-07-24 ENCOUNTER — Encounter: Payer: Self-pay | Admitting: Podiatry

## 2017-07-24 DIAGNOSIS — S90122A Contusion of left lesser toe(s) without damage to nail, initial encounter: Secondary | ICD-10-CM

## 2017-07-24 DIAGNOSIS — S99922A Unspecified injury of left foot, initial encounter: Secondary | ICD-10-CM
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
## 2017-08-21 ENCOUNTER — Ambulatory Visit (INDEPENDENT_AMBULATORY_CARE_PROVIDER_SITE_OTHER): Payer: BLUE CROSS/BLUE SHIELD | Admitting: Podiatry

## 2017-08-21 ENCOUNTER — Ambulatory Visit (INDEPENDENT_AMBULATORY_CARE_PROVIDER_SITE_OTHER): Payer: BLUE CROSS/BLUE SHIELD

## 2017-08-21 ENCOUNTER — Encounter: Payer: Self-pay | Admitting: Podiatry

## 2017-08-21 DIAGNOSIS — S90122D Contusion of left lesser toe(s) without damage to nail, subsequent encounter: Secondary | ICD-10-CM

## 2017-08-21 NOTE — Progress Notes (Signed)
She presents today for follow-up of pain and possible fractures fourth and fifth metatarsals of the left foot.  States that still swelling is still tender.  Objective: She presents today in regular shoes.  Still has pain on palpation fourth and fifth metatarsals of the left foot.  Radiographs taken today do not demonstrate any type of cortical interruptions.  Assessment: Probable stress reaction to the fourth and fifth metatarsals.  Plan: I instructed her to continue current therapy she will follow-up with me in about 6 weeks unless this is better.  When she follows up with me she needs to have another set of x-rays on that foot.

## 2017-08-28 DIAGNOSIS — H524 Presbyopia: Secondary | ICD-10-CM | POA: Diagnosis not present

## 2017-10-02 ENCOUNTER — Encounter: Payer: Self-pay | Admitting: Podiatry

## 2017-10-02 ENCOUNTER — Ambulatory Visit (INDEPENDENT_AMBULATORY_CARE_PROVIDER_SITE_OTHER): Payer: BLUE CROSS/BLUE SHIELD | Admitting: Podiatry

## 2017-10-02 ENCOUNTER — Ambulatory Visit (INDEPENDENT_AMBULATORY_CARE_PROVIDER_SITE_OTHER): Payer: BLUE CROSS/BLUE SHIELD

## 2017-10-02 DIAGNOSIS — M84375D Stress fracture, left foot, subsequent encounter for fracture with routine healing: Secondary | ICD-10-CM | POA: Diagnosis not present

## 2017-10-02 NOTE — Progress Notes (Signed)
She presents today for follow-up of a probable stress fracture fourth and fifth metatarsals of the left foot.  She states that she is doing much better it is one spot underneath the fifth is painful now.  Objective: Vital signs are stable alert and oriented x3.  There is no erythema edema saline strange odor radiographs taken today demonstrate some mild lucency beneath the head of the fifth metatarsal otherwise it appears to be perfectly normal.  Assessment: Well-healing fracture fifth met.  Plan: Follow-up with me as needed.

## 2017-11-04 ENCOUNTER — Other Ambulatory Visit: Payer: Self-pay | Admitting: Family Medicine

## 2018-02-17 DIAGNOSIS — Z1231 Encounter for screening mammogram for malignant neoplasm of breast: Secondary | ICD-10-CM | POA: Diagnosis not present

## 2018-02-17 LAB — HM MAMMOGRAPHY

## 2018-03-04 ENCOUNTER — Encounter: Payer: Self-pay | Admitting: General Practice

## 2018-05-16 ENCOUNTER — Other Ambulatory Visit: Payer: Self-pay | Admitting: Family Medicine

## 2018-05-22 ENCOUNTER — Other Ambulatory Visit: Payer: Self-pay | Admitting: Family Medicine

## 2018-05-28 ENCOUNTER — Other Ambulatory Visit: Payer: Self-pay | Admitting: Family Medicine

## 2018-05-28 ENCOUNTER — Telehealth: Payer: Self-pay | Admitting: *Deleted

## 2018-05-28 NOTE — Telephone Encounter (Signed)
Erroneous encounter

## 2018-08-03 ENCOUNTER — Ambulatory Visit (INDEPENDENT_AMBULATORY_CARE_PROVIDER_SITE_OTHER): Payer: BC Managed Care – PPO | Admitting: Family Medicine

## 2018-08-03 ENCOUNTER — Other Ambulatory Visit: Payer: Self-pay

## 2018-08-03 ENCOUNTER — Encounter: Payer: Self-pay | Admitting: Family Medicine

## 2018-08-03 VITALS — BP 106/75 | HR 72 | Temp 97.9°F | Resp 16 | Ht 65.0 in | Wt 133.4 lb

## 2018-08-03 DIAGNOSIS — M810 Age-related osteoporosis without current pathological fracture: Secondary | ICD-10-CM

## 2018-08-03 DIAGNOSIS — Z23 Encounter for immunization: Secondary | ICD-10-CM | POA: Diagnosis not present

## 2018-08-03 DIAGNOSIS — Z0000 Encounter for general adult medical examination without abnormal findings: Secondary | ICD-10-CM

## 2018-08-03 DIAGNOSIS — E785 Hyperlipidemia, unspecified: Secondary | ICD-10-CM | POA: Diagnosis not present

## 2018-08-03 LAB — LIPID PANEL
Cholesterol: 206 mg/dL — ABNORMAL HIGH (ref 0–200)
HDL: 70.5 mg/dL (ref >39.00)
LDL Cholesterol: 124 mg/dL — ABNORMAL HIGH (ref 0–99)
NonHDL: 135.38
Total CHOL/HDL Ratio: 3
Triglycerides: 59 mg/dL (ref 0.0–149.0)
VLDL: 11.8 mg/dL (ref 0.0–40.0)

## 2018-08-03 LAB — HEPATIC FUNCTION PANEL
ALT: 18 U/L (ref 0–35)
AST: 20 U/L (ref 0–37)
Albumin: 4.4 g/dL (ref 3.5–5.2)
Alkaline Phosphatase: 46 U/L (ref 39–117)
Bilirubin, Direct: 0.2 mg/dL (ref 0.0–0.3)
Total Bilirubin: 1.1 mg/dL (ref 0.2–1.2)
Total Protein: 6.4 g/dL (ref 6.0–8.3)

## 2018-08-03 LAB — BASIC METABOLIC PANEL
BUN: 17 mg/dL (ref 6–23)
CO2: 29 mEq/L (ref 19–32)
Calcium: 9.2 mg/dL (ref 8.4–10.5)
Chloride: 106 mEq/L (ref 96–112)
Creatinine, Ser: 0.8 mg/dL (ref 0.40–1.20)
GFR: 73.14 mL/min (ref >60.00)
Glucose, Bld: 87 mg/dL (ref 70–99)
Potassium: 4.2 mEq/L (ref 3.5–5.1)
Sodium: 142 mEq/L (ref 135–145)

## 2018-08-03 LAB — CBC WITH DIFFERENTIAL/PLATELET
Basophils Absolute: 0 10*3/uL (ref 0.0–0.1)
Basophils Relative: 1 % (ref 0.0–3.0)
Eosinophils Absolute: 0.2 10*3/uL (ref 0.0–0.7)
Eosinophils Relative: 4.6 % (ref 0.0–5.0)
HCT: 38.1 % (ref 36.0–46.0)
Hemoglobin: 12.8 g/dL (ref 12.0–15.0)
Lymphocytes Relative: 34.9 % (ref 12.0–46.0)
Lymphs Abs: 1.4 10*3/uL (ref 0.7–4.0)
MCHC: 33.5 g/dL (ref 30.0–36.0)
MCV: 89 fl (ref 78.0–100.0)
Monocytes Absolute: 0.4 10*3/uL (ref 0.1–1.0)
Monocytes Relative: 8.6 % (ref 3.0–12.0)
Neutro Abs: 2.1 10*3/uL (ref 1.4–7.7)
Neutrophils Relative %: 50.9 % (ref 43.0–77.0)
Platelets: 273 10*3/uL (ref 150.0–400.0)
RBC: 4.28 Mil/uL (ref 3.87–5.11)
RDW: 12.9 % (ref 11.5–15.5)
WBC: 4.1 10*3/uL (ref 4.0–10.5)

## 2018-08-03 LAB — VITAMIN D 25 HYDROXY (VIT D DEFICIENCY, FRACTURES): VITD: 51.45 ng/mL (ref 30.00–100.00)

## 2018-08-03 LAB — TSH: TSH: 1.74 u[IU]/mL (ref 0.35–4.50)

## 2018-08-03 NOTE — Addendum Note (Signed)
Addended by: Davis Gourd on: 08/03/2018 08:59 AM   Modules accepted: Orders

## 2018-08-03 NOTE — Assessment & Plan Note (Signed)
Pt's PE WNL.  UTD on mammogram, DEXA.  Tdap given. Check labs.  Anticipatory guidance provided.

## 2018-08-03 NOTE — Assessment & Plan Note (Signed)
Chronic problem.  Last DEXA showed improvement in L hip and spine.  Continue Fosamax for another year and reassess after next DEXA

## 2018-08-03 NOTE — Assessment & Plan Note (Signed)
Noted on last labs.  Attempting to control w/ diet and exercise.  Check labs.  Determine if medication needed

## 2018-08-03 NOTE — Progress Notes (Signed)
   Subjective:    Patient ID: Melanie Park, female    DOB: [birth year], 58 y.o.   MRN: [id number]  HPI CPE- UTD on colonoscopy, mammo, DEXA.  Due for Tdap today.   Review of Systems Patient reports no vision/ hearing changes, adenopathy,fever, weight change,  persistant/recurrent hoarseness , swallowing issues, chest pain, palpitations, edema, persistant/recurrent cough, hemoptysis, dyspnea (rest/exertional/paroxysmal nocturnal), gastrointestinal bleeding (melena, rectal bleeding), abdominal pain, significant heartburn, bowel changes, GU symptoms (dysuria, hematuria, incontinence), Gyn symptoms (abnormal  bleeding, pain),  syncope, focal weakness, memory loss, numbness & tingling, skin/hair/nail changes, abnormal bruising or bleeding, anxiety, or depression.     Objective:   Physical Exam General Appearance:    Alert, cooperative, no distress, appears stated age  Head:    Normocephalic, without obvious abnormality, atraumatic  Eyes:    PERRL, conjunctiva/corneas clear, EOM's intact, fundi    benign, both eyes  Ears:    Normal TM's and external ear canals, both ears  Nose:   Deferred due to COVID  Throat:   Neck:   Supple, symmetrical, trachea midline, no adenopathy;    Thyroid: no enlargement/tenderness/nodules  Back:     Symmetric, no curvature, ROM normal, no CVA tenderness  Lungs:     Clear to auscultation bilaterally, respirations unlabored  Chest Wall:    No tenderness or deformity   Heart:    Regular rate and rhythm, S1 and S2 normal, no murmur, rub   or gallop  Breast Exam:    Deferred to mammo  Abdomen:     Soft, non-tender, bowel sounds active all four quadrants,    no masses, no organomegaly  Genitalia:    Deferred  Rectal:    Extremities:   Extremities normal, atraumatic, no cyanosis or edema  Pulses:   2+ and symmetric all extremities  Skin:   Skin color, texture, turgor normal, no rashes or lesions  Lymph nodes:   Cervical, supraclavicular, and axillary nodes normal   Neurologic:   CNII-XII intact, normal strength, sensation and reflexes    throughout          Assessment & Plan:

## 2018-08-03 NOTE — Patient Instructions (Addendum)
Follow up in 1 year or as needed We'll notify you of your lab results and make any changes if needed Continue the Fosamax for another year Keep up the good work on healthy diet and regular exercise- you look great! Call with any questions or concerns Stay Safe!

## 2018-08-04 ENCOUNTER — Encounter: Payer: Self-pay | Admitting: General Practice

## 2018-09-28 DIAGNOSIS — Z23 Encounter for immunization: Secondary | ICD-10-CM | POA: Diagnosis not present

## 2018-11-13 ENCOUNTER — Other Ambulatory Visit: Payer: Self-pay | Admitting: Family Medicine

## 2019-02-16 ENCOUNTER — Telehealth: Payer: Self-pay

## 2019-02-16 NOTE — Telephone Encounter (Signed)
Patient called in stating she has her mammogram and bone density scheduled for next Tuesday with [facility]. She states they need an order for the bone density faxed to them.

## 2019-02-23 DIAGNOSIS — Z1231 Encounter for screening mammogram for malignant neoplasm of breast: Secondary | ICD-10-CM | POA: Diagnosis not present

## 2019-02-23 DIAGNOSIS — M8589 Other specified disorders of bone density and structure, multiple sites: Secondary | ICD-10-CM | POA: Diagnosis not present

## 2019-02-23 DIAGNOSIS — M810 Age-related osteoporosis without current pathological fracture: Secondary | ICD-10-CM | POA: Diagnosis not present

## 2019-02-23 LAB — HM MAMMOGRAPHY

## 2019-02-23 LAB — HM DEXA SCAN

## 2019-03-02 ENCOUNTER — Encounter: Payer: Self-pay | Admitting: General Practice

## 2019-03-19 ENCOUNTER — Encounter: Payer: Self-pay | Admitting: General Practice

## 2019-04-06 DIAGNOSIS — L821 Other seborrheic keratosis: Secondary | ICD-10-CM | POA: Diagnosis not present

## 2019-04-06 DIAGNOSIS — C44729 Squamous cell carcinoma of skin of left lower limb, including hip: Secondary | ICD-10-CM | POA: Diagnosis not present

## 2019-04-06 DIAGNOSIS — D485 Neoplasm of uncertain behavior of skin: Secondary | ICD-10-CM | POA: Diagnosis not present

## 2019-04-06 DIAGNOSIS — L570 Actinic keratosis: Secondary | ICD-10-CM | POA: Diagnosis not present

## 2019-04-30 ENCOUNTER — Telehealth: Payer: Self-pay

## 2019-04-30 NOTE — Telephone Encounter (Signed)
Patient states she has had diarrhea since Monday and she is headed to her son's wedding in [place]. Patient would like for you to call something into the [facility] in [place]. The number is [phone], address- [address] [place]. I informed patient that you might advise an OTC medication. Patient denies any nausea, vomiting, abdominal pain. She states she was a little gassy on Monday and her stomach was gurgling.

## 2019-04-30 NOTE — Telephone Encounter (Signed)
The first step would be to take OTC Imodium to slow stools.  If she is having blood, fever, abd pain or other concerning features, she should be seen at an Urgent Care.  She should eat a bland diet- bread, rice, applesauce, pretzels, etc- and avoid spicy/acidic foods or rich/fatty foods which can worsen diarrhea

## 2019-04-30 NOTE — Telephone Encounter (Signed)
Patient notified of PCP recommendations and is agreement and expresses an understanding.  

## 2019-05-05 ENCOUNTER — Telehealth (INDEPENDENT_AMBULATORY_CARE_PROVIDER_SITE_OTHER): Payer: BC Managed Care – PPO | Admitting: Family Medicine

## 2019-05-05 ENCOUNTER — Other Ambulatory Visit: Payer: Self-pay

## 2019-05-05 ENCOUNTER — Encounter: Payer: Self-pay | Admitting: Family Medicine

## 2019-05-05 DIAGNOSIS — R300 Dysuria: Secondary | ICD-10-CM | POA: Diagnosis not present

## 2019-05-05 DIAGNOSIS — R197 Diarrhea, unspecified: Secondary | ICD-10-CM

## 2019-05-05 MED ORDER — CEPHALEXIN 500 MG PO CAPS: 500.0000 mg | ORAL_CAPSULE | Freq: Two times a day (BID) | ORAL | 0 refills | Status: AC

## 2019-05-05 NOTE — Progress Notes (Signed)
I have discussed the procedure for the virtual visit with the patient who has given consent to proceed with assessment and treatment.   Pt unable to obtain vitals.   Melanie Park L Ervie Mccard, CMA     

## 2019-05-05 NOTE — Progress Notes (Signed)
Virtual Visit via Video   I connected with patient on 05/05/19 at 10:00 AM EDT by a video enabled telemedicine application and verified that I am speaking with the correct person using two identifiers.  Location patient: Home Location provider: [facility], Office Persons participating in the virtual visit: Patient, Provider, CMA (Jess B)  I discussed the limitations of evaluation and management by telemedicine and the availability of in person appointments. The patient expressed understanding and agreed to proceed.  Subjective:   HPI:   Diarrhea- sxs started ~10 days ago.  Initially thought it was stress related 'but it hasn't slowed down'.  + abd cramping.  No N/V.  No blood in stool.  Stools are no longer watery but remain loose.  4 BMs Sunday, 2 BMs Mon/Tues, today sxs are again worse- 2 BMs so far.  Has been eating bland diet.  Yesterday tried to [employer] and had yogurt and meatloaf.  Imodium provides temporary relief.  No recent abx.  No recent travel or new/different food.  No known sick contacts.  Drinking water and gatorade.  Taking OTC probiotic.  Dysuria- developed burning w/ urination starting last night.  + urinary frequency  ROS:   See pertinent positives and negatives per HPI.  Patient Active Problem List   Diagnosis Date Noted  . Hyperlipidemia 08/03/2018  . Osteoporosis 11/21/2016  . Dyspareunia (not due to a general medical condition) 11/14/2014  . Routine general medical examination at a health care facility 08/02/2013  . Gallstones 09/28/2012    Social History   Tobacco Use  . Smoking status: Never Smoker  . Smokeless tobacco: Never Used  Substance Use Topics  . Alcohol use: Yes    Comment: occasional    Current Outpatient Medications:  .  alendronate (FOSAMAX) 70 MG tablet, TAKE 1 TABLET BY MOUTH EVERY 7 DAYS (TAKE WITH FULL GLASS OF WATER ON EMPTY STOMACH), Disp: 12 tablet, Rfl: 1 .  calcium carbonate (TUMS - DOSED IN MG ELEMENTAL  CALCIUM) 500 MG chewable tablet, Chew 1 tablet by mouth daily., Disp: , Rfl:  .  MAGNESIUM PO, Take by mouth., Disp: , Rfl:  .  Multiple Vitamin (MULTIVITAMIN WITH MINERALS) TABS, Take 1 tablet by mouth daily., Disp: , Rfl:  .  Probiotic Product (PROBIOTIC ADVANCED PO), Take by mouth., Disp: , Rfl:  .  TURMERIC PO, Take by mouth., Disp: , Rfl:   Allergies  Allergen Reactions  . Codeine Other (See Comments)    headaches  . Morphine And Related     Crashed BP     Objective:   There were no vitals taken for this visit. AAOx3, NAD NCAT, EOMI No obvious CN deficits Coloring WNL Pt is able to speak clearly, coherently without shortness of breath or increased work of breathing.  Thought process is linear.  Mood is appropriate.   Assessment and Plan:   Diarrhea- new.  sxs are not consistent w/ bacterial infxn- no blood, no abd pain- and are more likely viral vs functional (stress).  Sxs were improving over the last 2 days and pt attempted to liberalize diet and had yogurt and meatloaf.  Suspect this was too much, too soon and has set her back.  Continue fluids, bland diet, probiotic.  If no improvement, will do stool studies later this week.  Pt expressed understanding and is in agreement w/ plan.   Dysuria- likely UTI given recent diarrhea.  Start Keflex.  Will follow.   Neena Rhymes, MD 05/05/2019

## 2019-05-06 DIAGNOSIS — C44729 Squamous cell carcinoma of skin of left lower limb, including hip: Secondary | ICD-10-CM | POA: Diagnosis not present

## 2019-05-06 DIAGNOSIS — L988 Other specified disorders of the skin and subcutaneous tissue: Secondary | ICD-10-CM | POA: Diagnosis not present

## 2019-05-11 ENCOUNTER — Other Ambulatory Visit: Payer: Self-pay | Admitting: Family Medicine

## 2019-05-14 ENCOUNTER — Telehealth: Payer: Self-pay

## 2019-05-14 DIAGNOSIS — R197 Diarrhea, unspecified: Secondary | ICD-10-CM

## 2019-05-14 NOTE — Telephone Encounter (Signed)
Per last office note Pt was advised next step would be stool studies. Please advise?

## 2019-05-14 NOTE — Telephone Encounter (Signed)
Patient states that she is still having diarrhea and would like for the nurse to contact her.

## 2019-05-14 NOTE — Telephone Encounter (Signed)
Yes, pt needs C diff and stool culture- dx diarrhea

## 2019-05-14 NOTE — Addendum Note (Signed)
Addended by: Geannie Risen on: 05/14/2019 08:57 AM   Modules accepted: Orders

## 2019-05-14 NOTE — Telephone Encounter (Signed)
Pt informed. Labs ordered.

## 2019-05-17 ENCOUNTER — Ambulatory Visit: Payer: BC Managed Care – PPO | Admitting: Emergency Medicine

## 2019-05-17 DIAGNOSIS — R197 Diarrhea, unspecified: Secondary | ICD-10-CM | POA: Diagnosis not present

## 2019-05-19 ENCOUNTER — Telehealth: Payer: Self-pay | Admitting: Family Medicine

## 2019-05-19 DIAGNOSIS — R197 Diarrhea, unspecified: Secondary | ICD-10-CM

## 2019-05-19 NOTE — Telephone Encounter (Signed)
Ok for stat

## 2019-05-19 NOTE — Telephone Encounter (Signed)
Ok for stat referral.  We did stool culture (results still pending) and C Diff (which was negative).  Is she taking Immodium

## 2019-05-19 NOTE — Telephone Encounter (Signed)
Referral has been sent, please advise pt on the test that she had done.   Melanie Park

## 2019-05-19 NOTE — Telephone Encounter (Signed)
Referral was placed 

## 2019-05-19 NOTE — Telephone Encounter (Signed)
Mychart was viewed.

## 2019-05-19 NOTE — Telephone Encounter (Signed)
Pt called in stating that she would like to have the referral placed for [facility], Dr. Ewing Schlein. She states that she would like to be seen asap. The diarrhea is getting worse, she states that she is having a difficult time making it to the bathroom. Please advise. Pt can be reached at the cell # and she wanted to ask what all we tested for and if everything came back normal.

## 2019-05-21 LAB — STOOL CULTURE
MICRO NUMBER:: 10459484
MICRO NUMBER:: 10459485
MICRO NUMBER:: 10459486
SHIGA RESULT:: NOT DETECTED
SPECIMEN QUALITY:: ADEQUATE
SPECIMEN QUALITY:: ADEQUATE
SPECIMEN QUALITY:: ADEQUATE

## 2019-05-21 LAB — TIQ-NTM

## 2019-05-21 LAB — CLOSTRIDIUM DIFFICILE TOXIN B, QUALITATIVE, REAL-TIME PCR: Toxigenic C. Difficile by PCR: NOT DETECTED

## 2019-05-26 DIAGNOSIS — R197 Diarrhea, unspecified: Secondary | ICD-10-CM | POA: Diagnosis not present

## 2019-05-26 DIAGNOSIS — Z86010 Personal history of colonic polyps: Secondary | ICD-10-CM | POA: Diagnosis not present

## 2019-05-26 DIAGNOSIS — R109 Unspecified abdominal pain: Secondary | ICD-10-CM | POA: Diagnosis not present

## 2019-05-27 ENCOUNTER — Ambulatory Visit
Admission: RE | Admit: 2019-05-27 | Discharge: 2019-05-27 | Disposition: A | Discharge status: Home / Self Care | Payer: BC Managed Care – PPO | Source: Ambulatory Visit | Attending: Physician Assistant | Admitting: Physician Assistant

## 2019-05-27 ENCOUNTER — Other Ambulatory Visit: Payer: Self-pay | Admitting: Physician Assistant

## 2019-05-27 DIAGNOSIS — R109 Unspecified abdominal pain: Secondary | ICD-10-CM

## 2019-05-27 DIAGNOSIS — R197 Diarrhea, unspecified: Secondary | ICD-10-CM | POA: Diagnosis not present

## 2019-05-27 MED ORDER — IOPAMIDOL (ISOVUE-300) INJECTION 61%
100.0000 mL | Freq: Once | INTRAVENOUS | Status: AC | PRN
  Administered 2019-05-27: 100 mL via INTRAVENOUS

## 2019-07-16 DIAGNOSIS — Z1159 Encounter for screening for other viral diseases: Secondary | ICD-10-CM | POA: Diagnosis not present

## 2019-07-21 ENCOUNTER — Encounter: Payer: Self-pay | Admitting: Family Medicine

## 2019-07-21 DIAGNOSIS — Z86010 Personal history of colonic polyps: Secondary | ICD-10-CM | POA: Diagnosis not present

## 2019-07-21 DIAGNOSIS — K635 Polyp of colon: Secondary | ICD-10-CM | POA: Diagnosis not present

## 2019-07-21 DIAGNOSIS — K5730 Diverticulosis of large intestine without perforation or abscess without bleeding: Secondary | ICD-10-CM | POA: Diagnosis not present

## 2019-09-09 ENCOUNTER — Encounter: Payer: Self-pay | Admitting: Podiatry

## 2019-09-09 ENCOUNTER — Other Ambulatory Visit: Payer: Self-pay

## 2019-09-09 ENCOUNTER — Ambulatory Visit (INDEPENDENT_AMBULATORY_CARE_PROVIDER_SITE_OTHER): Payer: BC Managed Care – PPO

## 2019-09-09 ENCOUNTER — Ambulatory Visit (INDEPENDENT_AMBULATORY_CARE_PROVIDER_SITE_OTHER): Payer: BC Managed Care – PPO | Admitting: Podiatry

## 2019-09-09 DIAGNOSIS — M2011 Hallux valgus (acquired), right foot: Secondary | ICD-10-CM

## 2019-09-09 DIAGNOSIS — M2041 Other hammer toe(s) (acquired), right foot: Secondary | ICD-10-CM

## 2019-09-09 NOTE — Progress Notes (Signed)
She presents today after having not seen her in a couple years with a chief complaint of a painful bunion to the right foot.  States that this is starting to bother me on a daily basis and it is becoming more more painful is starting to do because we not perform the daily activities that I enjoy doing.  She states that this was not as bad as the one on the left but I would like to consider having it corrected as well.  She denies fever chills nausea vomiting muscle aches pains.  She denies changes in her past medical history medications allergies surgeries and social history.  Objective: Vital signs are stable she is alert and oriented x3 pulses are palpable.  There is no erythema edema cellulitis drainage or odor neurologic sensorium is intact.  Deep to reflexes are intact muscle strength is normal and symmetrical bilateral.  She has an increase in the first intermetatarsal angle greater than normal value with a hypertrophic medial condyle to the head of the first metatarsal and she has hypermobility at the level of the first metatarsal medial cuneiform joint.  The hallux is in an abducted valgus attitude.  She has hammertoe deformity in all planes second and third toes with some osteoarthritic changes to the third toe of the right foot.  Radiographs taken today confirm an increase in the first intermetatarsal angle and digital deformities as well as arthritis.  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  Hammertoe deformity #2 #3 of the right foot.  Plan: We discussed etiology pathology conservative versus surgical therapies.  At this point she would like to consider surgical intervention.  I expressed to her because of the hypermobility of the joint it would most likely require Lapidus procedure but in a sequential reduction we would take a look at it and see if we could do a capital osteotomy to help her healing time.  She understands this and  is amenable to it.  We will also go ahead and perform hammertoe procedures with screws to the second and third toe of the right foot.  We did discuss the possible postop complications which may include but are not limited to postop pain bleeding swelling infection recurrence need for further surgery overcorrection under correction also digit loss of limb loss of life.  She received information regarding the surgery center anesthesia group and instructions for the morning of surgery.  Follow-up with her in a few weeks for surgical intervention.

## 2019-09-15 ENCOUNTER — Encounter: Payer: Self-pay | Admitting: Family Medicine

## 2019-09-15 ENCOUNTER — Encounter: Payer: Self-pay | Admitting: General Practice

## 2019-09-15 ENCOUNTER — Other Ambulatory Visit: Payer: Self-pay

## 2019-09-15 ENCOUNTER — Ambulatory Visit (INDEPENDENT_AMBULATORY_CARE_PROVIDER_SITE_OTHER): Payer: BC Managed Care – PPO | Admitting: Family Medicine

## 2019-09-15 VITALS — BP 90/60 | HR 69 | Temp 97.6°F | Resp 16 | Ht 65.0 in | Wt 130.5 lb

## 2019-09-15 DIAGNOSIS — E785 Hyperlipidemia, unspecified: Secondary | ICD-10-CM | POA: Diagnosis not present

## 2019-09-15 DIAGNOSIS — Z0000 Encounter for general adult medical examination without abnormal findings: Secondary | ICD-10-CM | POA: Diagnosis not present

## 2019-09-15 DIAGNOSIS — M810 Age-related osteoporosis without current pathological fracture: Secondary | ICD-10-CM | POA: Diagnosis not present

## 2019-09-15 LAB — CBC WITH DIFFERENTIAL/PLATELET
Basophils Absolute: 0 10*3/uL (ref 0.0–0.1)
Basophils Relative: 1 % (ref 0.0–3.0)
Eosinophils Absolute: 0.1 10*3/uL (ref 0.0–0.7)
Eosinophils Relative: 3.3 % (ref 0.0–5.0)
HCT: 39.5 % (ref 36.0–46.0)
Hemoglobin: 13.3 g/dL (ref 12.0–15.0)
Lymphocytes Relative: 32.1 % (ref 12.0–46.0)
Lymphs Abs: 1.4 10*3/uL (ref 0.7–4.0)
MCHC: 33.7 g/dL (ref 30.0–36.0)
MCV: 87.7 fl (ref 78.0–100.0)
Monocytes Absolute: 0.3 10*3/uL (ref 0.1–1.0)
Monocytes Relative: 7.7 % (ref 3.0–12.0)
Neutro Abs: 2.5 10*3/uL (ref 1.4–7.7)
Neutrophils Relative %: 55.9 % (ref 43.0–77.0)
Platelets: 286 10*3/uL (ref 150.0–400.0)
RBC: 4.5 Mil/uL (ref 3.87–5.11)
RDW: 13.1 % (ref 11.5–15.5)
WBC: 4.4 10*3/uL (ref 4.0–10.5)

## 2019-09-15 LAB — BASIC METABOLIC PANEL
BUN: 16 mg/dL (ref 6–23)
CO2: 31 mEq/L (ref 19–32)
Calcium: 9.7 mg/dL (ref 8.4–10.5)
Chloride: 103 mEq/L (ref 96–112)
Creatinine, Ser: 0.75 mg/dL (ref 0.40–1.20)
GFR: 78.5 mL/min (ref >60.00)
Glucose, Bld: 84 mg/dL (ref 70–99)
Potassium: 4.2 mEq/L (ref 3.5–5.1)
Sodium: 141 mEq/L (ref 135–145)

## 2019-09-15 LAB — HEPATIC FUNCTION PANEL
ALT: 17 U/L (ref 0–35)
AST: 18 U/L (ref 0–37)
Albumin: 4.5 g/dL (ref 3.5–5.2)
Alkaline Phosphatase: 48 U/L (ref 39–117)
Bilirubin, Direct: 0.2 mg/dL (ref 0.0–0.3)
Total Bilirubin: 1.4 mg/dL — ABNORMAL HIGH (ref 0.2–1.2)
Total Protein: 6.6 g/dL (ref 6.0–8.3)

## 2019-09-15 LAB — LIPID PANEL
Cholesterol: 218 mg/dL — ABNORMAL HIGH (ref 0–200)
HDL: 72.5 mg/dL (ref >39.00)
LDL Cholesterol: 128 mg/dL — ABNORMAL HIGH (ref 0–99)
NonHDL: 145.93
Total CHOL/HDL Ratio: 3
Triglycerides: 91 mg/dL (ref 0.0–149.0)
VLDL: 18.2 mg/dL (ref 0.0–40.0)

## 2019-09-15 LAB — TSH: TSH: 2.43 u[IU]/mL (ref 0.35–4.50)

## 2019-09-15 LAB — VITAMIN D 25 HYDROXY (VIT D DEFICIENCY, FRACTURES): VITD: 45.5 ng/mL (ref 30.00–100.00)

## 2019-09-15 NOTE — Patient Instructions (Signed)
Follow up in 1 year or as needed We'll notify you of your lab results and make any changes if needed Keep up the good work!  You look great! Let us know when you get the flu shot and we will update your chart Call with any questions or concerns Stay Safe!  Stay Healthy!

## 2019-09-15 NOTE — Assessment & Plan Note (Signed)
Pt's PE WNL.  UTD on mammo, DEXA, colonoscopy.  UTD on Tdap, COVID.  Check labs.  Anticipatory guidance provided.

## 2019-09-15 NOTE — Assessment & Plan Note (Signed)
Chronic problem.  Attempting to control w/ diet and exercise.  Check labs.  Start meds prn. 

## 2019-09-15 NOTE — Progress Notes (Signed)
   Subjective:    Patient ID: Melanie Park, female    DOB: 06-25-1960, 59 y.o.   MRN: [id number]  HPI CPE- UTD on mammo, DEXA, colonoscopy, Tdap.  Will get flu shot at work.  UTD on COVID vaccines.  Reviewed past medical, surgical, family and social histories.   Patient Care Team    Relationship Specialty Notifications Start End  Sheliah Hatch, MD PCP - General Family Medicine  08/02/13   Vida Rigger, MD Consulting Physician Gastroenterology  11/14/14      Health Maintenance  Topic Date Due  . INFLUENZA VACCINE  04/06/2020 (Originally 08/08/2019)  . Hepatitis C Screening  09/14/2020 (Originally 1960-01-28)  . HIV Screening  09/14/2020 (Originally 06/29/1973)  . MAMMOGRAM  02/23/2020  . DEXA SCAN  02/22/2021  . COLONOSCOPY  07/20/2024  . TETANUS/TDAP  08/02/2028  . COVID-19 Vaccine  Completed      Review of Systems Patient reports no vision/ hearing changes, adenopathy,fever, weight change,  persistant/recurrent hoarseness , swallowing issues, chest pain, palpitations, edema, persistant/recurrent cough, hemoptysis, dyspnea (rest/exertional/paroxysmal nocturnal), gastrointestinal bleeding (melena, rectal bleeding), abdominal pain, significant heartburn, bowel changes, GU symptoms (dysuria, hematuria, incontinence), Gyn symptoms (abnormal  bleeding, pain),  syncope, focal weakness, memory loss, numbness & tingling, skin/hair/nail changes, abnormal bruising or bleeding, anxiety, or depression.   This visit occurred during the SARS-CoV-2 public health emergency.  Safety protocols were in place, including screening questions prior to the visit, additional usage of staff PPE, and extensive cleaning of exam room while observing appropriate contact time as indicated for disinfecting solutions.       Objective:   Physical Exam General Appearance:    Alert, cooperative, no distress, appears stated age  Head:    Normocephalic, without obvious abnormality, atraumatic  Eyes:    PERRL,  conjunctiva/corneas clear, EOM's intact, fundi    benign, both eyes  Ears:    Normal TM's and external ear canals, both ears  Nose:   Nares normal, septum midline, mucosa normal, no drainage    or sinus tenderness  Throat:   Lips, mucosa, and tongue normal; teeth and gums normal  Neck:   Supple, symmetrical, trachea midline, no adenopathy;    Thyroid: no enlargement/tenderness/nodules  Back:     Symmetric, no curvature, ROM normal, no CVA tenderness  Lungs:     Clear to auscultation bilaterally, respirations unlabored  Chest Wall:    No tenderness or deformity   Heart:    Regular rate and rhythm, S1 and S2 normal, no murmur, rub   or gallop  Breast Exam:    Deferred to mammo  Abdomen:     Soft, non-tender, bowel sounds active all four quadrants,    no masses, no organomegaly  Genitalia:    Deferred  Rectal:    Extremities:   Extremities normal, atraumatic, no cyanosis or edema  Pulses:   2+ and symmetric all extremities  Skin:   Skin color, texture, turgor normal, no rashes or lesions  Lymph nodes:   Cervical, supraclavicular, and axillary nodes normal  Neurologic:   CNII-XII intact, normal strength, sensation and reflexes    throughout          Assessment & Plan:

## 2019-09-15 NOTE — Assessment & Plan Note (Signed)
Chronic problem.  UTD on DEXA.  Check Vit D and replete prn. 

## 2019-09-28 DIAGNOSIS — D1801 Hemangioma of skin and subcutaneous tissue: Secondary | ICD-10-CM | POA: Diagnosis not present

## 2019-09-28 DIAGNOSIS — Z85828 Personal history of other malignant neoplasm of skin: Secondary | ICD-10-CM | POA: Diagnosis not present

## 2019-09-28 DIAGNOSIS — L814 Other melanin hyperpigmentation: Secondary | ICD-10-CM | POA: Diagnosis not present

## 2019-09-28 DIAGNOSIS — D225 Melanocytic nevi of trunk: Secondary | ICD-10-CM | POA: Diagnosis not present

## 2019-09-28 DIAGNOSIS — D485 Neoplasm of uncertain behavior of skin: Secondary | ICD-10-CM | POA: Diagnosis not present

## 2019-10-15 DIAGNOSIS — Z23 Encounter for immunization: Secondary | ICD-10-CM | POA: Diagnosis not present

## 2019-11-10 DIAGNOSIS — D485 Neoplasm of uncertain behavior of skin: Secondary | ICD-10-CM | POA: Diagnosis not present

## 2019-11-10 DIAGNOSIS — L988 Other specified disorders of the skin and subcutaneous tissue: Secondary | ICD-10-CM | POA: Diagnosis not present

## 2019-11-11 ENCOUNTER — Telehealth: Payer: Self-pay

## 2019-11-11 NOTE — Telephone Encounter (Signed)
DOS 11/26/2019  LAPIDUS PROC INS BUNIONECTOMY RT - 32951 HAMMERTOE REPAIR 2,3 RT - [id number]  BCBS EFFECTIVE DATE - 12/08/2018  PLAN DEDUCTIBLE - $2000.00 W/ $0.00 REMAINING OUT OF POCKET - $4000.00 W/ $1270.31 REMAINING COPAY $0.00 COINSURANCE - 40% PER SERVICE YEAR  NO PRECERT REQUIRED PER WEBSITE

## 2019-11-17 ENCOUNTER — Other Ambulatory Visit: Payer: Self-pay | Admitting: Family Medicine

## 2019-11-20 HISTORY — PX: FOOT SURGERY: SHX648

## 2019-11-24 ENCOUNTER — Other Ambulatory Visit: Payer: Self-pay | Admitting: Podiatry

## 2019-11-24 MED ORDER — ONDANSETRON HCL 4 MG PO TABS: 4.0000 mg | ORAL_TABLET | Freq: Three times a day (TID) | ORAL | 0 refills | Status: DC | PRN

## 2019-11-24 MED ORDER — CEPHALEXIN 500 MG PO CAPS: 500.0000 mg | ORAL_CAPSULE | Freq: Three times a day (TID) | ORAL | 0 refills | Status: DC

## 2019-11-24 MED ORDER — OXYCODONE-ACETAMINOPHEN 10-325 MG PO TABS: 1.0000 | ORAL_TABLET | Freq: Three times a day (TID) | ORAL | 0 refills | Status: AC | PRN

## 2019-11-26 ENCOUNTER — Telehealth: Payer: Self-pay | Admitting: Podiatry

## 2019-11-26 DIAGNOSIS — M25571 Pain in right ankle and joints of right foot: Secondary | ICD-10-CM | POA: Diagnosis not present

## 2019-11-26 DIAGNOSIS — M2041 Other hammer toe(s) (acquired), right foot: Secondary | ICD-10-CM | POA: Diagnosis not present

## 2019-11-26 DIAGNOSIS — M2011 Hallux valgus (acquired), right foot: Secondary | ICD-10-CM | POA: Diagnosis not present

## 2019-11-26 NOTE — Telephone Encounter (Signed)
Patient would like a handicap pass, she had surgery today. Please advise?

## 2019-11-26 NOTE — Telephone Encounter (Signed)
Returned call-  L/M she could come by the office and get a form to take to the [facility] for the handicap sticker

## 2019-12-07 ENCOUNTER — Encounter: Payer: Self-pay | Admitting: Podiatry

## 2019-12-07 ENCOUNTER — Ambulatory Visit (INDEPENDENT_AMBULATORY_CARE_PROVIDER_SITE_OTHER): Payer: BC Managed Care – PPO

## 2019-12-07 ENCOUNTER — Other Ambulatory Visit: Payer: Self-pay

## 2019-12-07 ENCOUNTER — Ambulatory Visit (INDEPENDENT_AMBULATORY_CARE_PROVIDER_SITE_OTHER): Payer: BC Managed Care – PPO | Admitting: Podiatry

## 2019-12-07 VITALS — BP 126/79 | HR 70 | Temp 97.2°F

## 2019-12-07 DIAGNOSIS — M2041 Other hammer toe(s) (acquired), right foot: Secondary | ICD-10-CM | POA: Diagnosis not present

## 2019-12-07 DIAGNOSIS — M2011 Hallux valgus (acquired), right foot: Secondary | ICD-10-CM

## 2019-12-07 DIAGNOSIS — Z9889 Other specified postprocedural states: Secondary | ICD-10-CM

## 2019-12-07 NOTE — Progress Notes (Signed)
She presents today for her first postop visit date of surgery 11/26/2019 status post Lapidus with bunionectomy and hammertoe repair second and third toes.  She also has a cast on.  She denies fever chills nausea vomiting muscle aches pains calf pain back pain chest pain shortness of breath.  Objective: She presents today with her knee scooter crutches dry sterile dressing intact under the cast.  She has good range of motion of her toes and her vital signs are stable she is alert and oriented x3.  Radiographs taken today do demonstrate a very nice Lapidus procedure and hammertoe repair with pins to the toes.  Assessment: Well-healing surgical foot x10 days.  Plan: Follow-up with me in 1 week for cast change.

## 2019-12-14 ENCOUNTER — Ambulatory Visit (INDEPENDENT_AMBULATORY_CARE_PROVIDER_SITE_OTHER): Payer: BC Managed Care – PPO | Admitting: Podiatry

## 2019-12-14 ENCOUNTER — Other Ambulatory Visit: Payer: Self-pay

## 2019-12-14 ENCOUNTER — Encounter: Payer: Self-pay | Admitting: Podiatry

## 2019-12-14 DIAGNOSIS — M2041 Other hammer toe(s) (acquired), right foot: Secondary | ICD-10-CM

## 2019-12-14 DIAGNOSIS — M2011 Hallux valgus (acquired), right foot: Secondary | ICD-10-CM

## 2019-12-14 DIAGNOSIS — Z9889 Other specified postprocedural states: Secondary | ICD-10-CM

## 2019-12-14 NOTE — Progress Notes (Signed)
She presents today for follow-up of her Lapidus with bunionectomy hammertoe repair #2 #3 mobic cast application. She denies fever chills nausea vomiting muscle aches pains calf pain back pain chest pain shortness of breath.  Objective: Vital signs are stable alert oriented x3 cast is intact was removed demonstrates rest of dressing intact once removed demonstrates K wire is intact sutures are intact margins are well coapted.  Assessment: Well-healing surgical foot.  Plan: Redressed today dressed compressive dressing that the stitches in. We will follow-up with her in 2 weeks. Recasted her today continue nonweightbearing.  She needs an x-ray next visit

## 2019-12-28 ENCOUNTER — Encounter: Payer: Self-pay | Admitting: Podiatry

## 2019-12-28 ENCOUNTER — Other Ambulatory Visit: Payer: Self-pay

## 2019-12-28 ENCOUNTER — Encounter: Payer: BC Managed Care – PPO | Admitting: Podiatry

## 2019-12-28 ENCOUNTER — Ambulatory Visit (INDEPENDENT_AMBULATORY_CARE_PROVIDER_SITE_OTHER): Payer: BC Managed Care – PPO

## 2019-12-28 ENCOUNTER — Ambulatory Visit (INDEPENDENT_AMBULATORY_CARE_PROVIDER_SITE_OTHER): Payer: BC Managed Care – PPO | Admitting: Podiatry

## 2019-12-28 DIAGNOSIS — M2011 Hallux valgus (acquired), right foot: Secondary | ICD-10-CM

## 2019-12-29 NOTE — Progress Notes (Signed)
Melanie Park presents with her husband today for follow-up of a Lapidus procedure date of surgery nearly 5 weeks ago 11/26/2019 hammertoe repair #2 #3 with a Lapidus bunionectomy.  She denies any trauma to the foot.  However she does state that she fell and hurt her back.  Objective: Cast was intact slightly dirty on the bottom once removed demonstrates all joints distal to the ankle, full range of motion without crepitus with exception of those being fused.  There is minimal edema no erythema cellulitis drainage or odor incision sites are healing very nicely sutures are intact margins are coapted sutures were removed today.  Margins remain well coapted K wires are in the toes #2 #3 of the foot however the the second wire was bent down which is also visible on radiograph I did rotated back up today with some considerable discomfort on her behalf more than likely this will have to be numbed up to remove this wire she is not healing as of yet across the fusion site however most likely she will in the next few weeks start that bridging process.  Assessment well-healing surgical foot band wire second toe.  Plan: Placed her in a stockinette today placed her back in a cam walker told her not to bear weight on this only thing that she can do to touch the heel to the floor otherwise she is to continue using crutches she states that she does not want to have to continue to use crutches she is ready to start walking on this I explained to her that is not walking more as of yet I think she understands why.  I will follow-up with her in a couple weeks at which time we hope to be able to walk her then.

## 2020-01-11 ENCOUNTER — Encounter: Payer: BC Managed Care – PPO | Admitting: Podiatry

## 2020-01-13 ENCOUNTER — Other Ambulatory Visit: Payer: Self-pay

## 2020-01-13 ENCOUNTER — Ambulatory Visit (INDEPENDENT_AMBULATORY_CARE_PROVIDER_SITE_OTHER): Payer: BC Managed Care – PPO | Admitting: Podiatry

## 2020-01-13 ENCOUNTER — Ambulatory Visit (INDEPENDENT_AMBULATORY_CARE_PROVIDER_SITE_OTHER): Payer: BC Managed Care – PPO

## 2020-01-13 DIAGNOSIS — Z9889 Other specified postprocedural states: Secondary | ICD-10-CM

## 2020-01-13 DIAGNOSIS — M2041 Other hammer toe(s) (acquired), right foot: Secondary | ICD-10-CM

## 2020-01-13 DIAGNOSIS — M2011 Hallux valgus (acquired), right foot: Secondary | ICD-10-CM

## 2020-01-13 NOTE — Progress Notes (Signed)
She for presents today for her postop visit date of surgery 11/26/2019 status post Lapidus procedure right with hammertoe repair second and third toes right foot.  She denies fever chills nausea vomiting muscle aches pains states that she has been doing quite well utilizing knee scooter.  Objective: Vital signs are stable alert oriented x3 mood was on today once removed demonstrates no erythema edema cellulitis drainage or odor it appears that she has been doing very well with this.  K wires are in place to toes #2 #3.  Assessment: Well-healing surgical foot  Plan: After local anesthetic was administered I was able to remove the K wires from toes #2 #3 of the right foot the second K wire was banded about a 25 degree angle but the toe seems to be sitting rectus.  I am going to put her back in a cam walker we will start partial weightbearing I will follow-up with full weightbearing in 2 weeks.  Radiographs taken today demonstrate complete removal of wires to the toes she appears to be healing at the first TMT joint.

## 2020-01-27 ENCOUNTER — Ambulatory Visit (INDEPENDENT_AMBULATORY_CARE_PROVIDER_SITE_OTHER): Payer: BC Managed Care – PPO

## 2020-01-27 ENCOUNTER — Ambulatory Visit (INDEPENDENT_AMBULATORY_CARE_PROVIDER_SITE_OTHER): Payer: BC Managed Care – PPO | Admitting: Podiatry

## 2020-01-27 ENCOUNTER — Other Ambulatory Visit: Payer: Self-pay

## 2020-01-27 DIAGNOSIS — M2011 Hallux valgus (acquired), right foot: Secondary | ICD-10-CM | POA: Diagnosis not present

## 2020-01-27 DIAGNOSIS — Z9889 Other specified postprocedural states: Secondary | ICD-10-CM

## 2020-01-27 DIAGNOSIS — M2041 Other hammer toe(s) (acquired), right foot: Secondary | ICD-10-CM

## 2020-01-27 NOTE — Progress Notes (Signed)
She presents today for 2-week follow-up status post Lapidus procedure and segment osteotomy hammertoe repair #2 #3 cast on the right foot. He states that she is really doing well she has been walking with the cam walker without any problems. Has had absolutely no pain whatsoever.  Objective: Vital signs are stable she is alert oriented x3 there is no erythema edema cellulitis drainage or odor toes are rectus. Radiographs taken today demonstrate a well-healing first metatarsal medial cuneiform joint fusion. Complete fusion of the PIPJ second and third toes.  Assessment: Well-healing surgical foot.  Plan: At this point and request that she try to stay in the cam walker for at least another 2 weeks if she can get into a pair of boots or tennis shoes and I will follow-up with her in 3 weeks for [place] another set of x-rays. Hopefully that will be her final set.

## 2020-02-10 ENCOUNTER — Encounter: Payer: BC Managed Care – PPO | Admitting: Podiatry

## 2020-02-15 ENCOUNTER — Encounter: Payer: Self-pay | Admitting: Podiatry

## 2020-02-15 ENCOUNTER — Ambulatory Visit (INDEPENDENT_AMBULATORY_CARE_PROVIDER_SITE_OTHER): Payer: BC Managed Care – PPO | Admitting: Podiatry

## 2020-02-15 ENCOUNTER — Ambulatory Visit (INDEPENDENT_AMBULATORY_CARE_PROVIDER_SITE_OTHER): Payer: BC Managed Care – PPO

## 2020-02-15 ENCOUNTER — Other Ambulatory Visit: Payer: Self-pay

## 2020-02-15 DIAGNOSIS — M2041 Other hammer toe(s) (acquired), right foot: Secondary | ICD-10-CM

## 2020-02-15 DIAGNOSIS — M2011 Hallux valgus (acquired), right foot: Secondary | ICD-10-CM

## 2020-02-15 DIAGNOSIS — Z9889 Other specified postprocedural states: Secondary | ICD-10-CM

## 2020-02-15 NOTE — Progress Notes (Signed)
She presents today for her postop visit November 26, 2019 status post Lapidus procedure with bunionectomy and hammertoe repair second and third toes with cast.  She says I think is finally healed.  Objective: Vital signs are stable she is alert and oriented x3 no edema no erythema cellulitis drainage odor skin good range of motion of the first and second metatarsophalangeal joints slightly restricted but minimally so.  She has no pain on palpation of the foot at all.  Radiographs taken today demonstrate an osseously mature individual with a Lapidus procedure toe is rectus and internal fixation is in good position does not appear to be loosening.  Assessment well-healing surgical foot.  Plan: At this point I will allow her to start back in her regular shoe gear.  She is should she start to develop pain she will go back and forth between her shoe gear and her cam walker.  If pain persists she will notify us and we will have an x-ray performed.  Otherwise able to follow-up with her in a couple weeks for final x-ray at that time.

## 2020-03-07 DIAGNOSIS — Z1231 Encounter for screening mammogram for malignant neoplasm of breast: Secondary | ICD-10-CM | POA: Diagnosis not present

## 2020-03-07 LAB — HM MAMMOGRAPHY

## 2020-04-09 DIAGNOSIS — H9209 Otalgia, unspecified ear: Secondary | ICD-10-CM | POA: Diagnosis not present

## 2020-05-11 ENCOUNTER — Other Ambulatory Visit: Payer: Self-pay | Admitting: Family Medicine

## 2020-05-19 ENCOUNTER — Other Ambulatory Visit: Payer: Self-pay

## 2020-05-19 ENCOUNTER — Encounter: Payer: Self-pay | Admitting: Registered Nurse

## 2020-05-19 ENCOUNTER — Telehealth (INDEPENDENT_AMBULATORY_CARE_PROVIDER_SITE_OTHER): Payer: BC Managed Care – PPO | Admitting: Registered Nurse

## 2020-05-19 DIAGNOSIS — J302 Other seasonal allergic rhinitis: Secondary | ICD-10-CM

## 2020-05-19 MED ORDER — AZELASTINE HCL 0.1 % NA SOLN: 1.0000 | Freq: Two times a day (BID) | NASAL | 12 refills | Status: DC

## 2020-05-19 MED ORDER — PREDNISONE 10 MG (21) PO TBPK: ORAL_TABLET | ORAL | 0 refills | Status: DC

## 2020-05-19 MED ORDER — BENZONATATE 200 MG PO CAPS: 200.0000 mg | ORAL_CAPSULE | Freq: Two times a day (BID) | ORAL | 0 refills | Status: DC | PRN

## 2020-05-19 NOTE — Progress Notes (Signed)
Telemedicine Encounter- SOAP NOTE Established Patient  This telephone encounter was conducted with the patient's (or proxy's) verbal consent via audio telecommunications: yes  Patient was instructed to have this encounter in a suitably private space; and to only have persons present to whom they give permission to participate. In addition, patient identity was confirmed by use of name plus two identifiers (DOB and address).  I discussed the limitations, risks, security and privacy concerns of performing an evaluation and management service by telephone and the availability of in person appointments. I also discussed with the patient that there may be a patient responsible charge related to this service. The patient expressed understanding and agreed to proceed.  I spent a total of 14 minutes talking with the patient or their proxy.  Patient at home Provider in office  Participants: Melanie Sportsman, NP and Melanie Park  Chief Complaint  Patient presents with  . Cough    P[atient states for the last 6 weeks she has been experiencing some cough ,and nasal congestion that has to be a sinus infection. Patient states in April she had a visit and was prescribed an antibiotic then she went on vacation and came back with the same symptoms.      Subjective   Melanie Park is a 60 y.o. established patient. Telephone visit today for seasonal allergies  HPI On and off for past 6 weeks Had teledoc visit in early April and was given abx. May have helped some [place], returned when she came back to [place].  Has used OTC allergy medication and flonase with some relief No lower respiratory symptoms Denies GI, CV, and neuro symptoms  Otherwise no concerns.  Patient Active Problem List   Diagnosis Date Noted  . Hyperlipidemia 08/03/2018  . Osteoporosis 11/21/2016  . Dyspareunia (not due to a general medical condition) 11/14/2014  . Routine  general medical examination at a health care facility 08/02/2013  . Gallstones 09/28/2012    Past Medical History:  Diagnosis Date  . Diverticulitis   . Frequent headaches   . History of chicken pox   . Inflammatory polyps of colon (HCC)   . Migraine   . UTI (urinary tract infection)     Current Outpatient Medications  Medication Sig Dispense Refill  . alendronate (FOSAMAX) 70 MG tablet TAKE 1 TABLET BY MOUTH EVERY 7 DAYS (TAKE WITH FULL GLASS OF WATER ON EMPTY STOMACH) 12 tablet 1  . azelastine (ASTELIN) 0.1 % nasal spray Place 1 spray into both nostrils 2 (two) times daily. Use in each nostril as directed 30 mL 12  . benzonatate (TESSALON) 200 MG capsule Take 1 capsule (200 mg total) by mouth 2 (two) times daily as needed for cough. 20 capsule 0  . calcium carbonate (TUMS - DOSED IN MG ELEMENTAL CALCIUM) 500 MG chewable tablet Chew 1 tablet by mouth daily.    . fluorouracil (EFUDEX) 5 % cream Apply topically.    Marland Kitchen MAGNESIUM PO Take by mouth.    . Multiple Vitamin (MULTIVITAMIN WITH MINERALS) TABS Take 1 tablet by mouth daily.    . ondansetron (ZOFRAN) 4 MG tablet Take 1 tablet (4 mg total) by mouth every 8 (eight) hours as needed. 20 tablet 0  . predniSONE (STERAPRED UNI-PAK 21 TAB) 10 MG (21) TBPK tablet Take per package instructions. Do not skip doses. Finish entire supply. 1 each 0  . Probiotic Product (PROBIOTIC ADVANCED PO) Take by mouth.  No current facility-administered medications for this visit.    Allergies  Allergen Reactions  . Codeine Other (See Comments)    headaches  . Morphine And Related     Crashed BP   . Other Other (See Comments)    Social History   Socioeconomic History  . Marital status: Married    Spouse name: Not on file  . Number of children: Not on file  . Years of education: Not on file  . Highest education level: Not on file  Occupational History  . Not on file  Tobacco Use  . Smoking status: Never Smoker  . Smokeless tobacco: Never  Used  Vaping Use  . Vaping Use: Never used  Substance and Sexual Activity  . Alcohol use: Yes    Comment: occasional  . Drug use: No  . Sexual activity: Not on file  Other Topics Concern  . Not on file  Social History Narrative  . Not on file   Social Determinants of Health   Financial Resource Strain: Not on file  Food Insecurity: Not on file  Transportation Needs: Not on file  Physical Activity: Not on file  Stress: Not on file  Social Connections: Not on file  Intimate Partner Violence: Not on file    Review of Systems  Constitutional: Negative.   HENT: Positive for congestion and sinus pain.   Eyes: Negative.   Respiratory: Positive for cough. Negative for hemoptysis, sputum production, shortness of breath and wheezing.   Cardiovascular: Negative.   Gastrointestinal: Negative.   Genitourinary: Negative.   Musculoskeletal: Negative.   Skin: Negative.   Neurological: Negative.   Endo/Heme/Allergies: Negative.   Psychiatric/Behavioral: Negative.   All other systems reviewed and are negative.   Objective   Vitals as reported by the patient: There were no vitals filed for this visit.  [name] was seen today for cough.  Diagnoses and all orders for this visit:  Seasonal allergies -     benzonatate (TESSALON) 200 MG capsule; Take 1 capsule (200 mg total) by mouth 2 (two) times daily as needed for cough. -     predniSONE (STERAPRED UNI-PAK 21 TAB) 10 MG (21) TBPK tablet; Take per package instructions. Do not skip doses. Finish entire supply. -     azelastine (ASTELIN) 0.1 % nasal spray; Place 1 spray into both nostrils 2 (two) times daily. Use in each nostril as directed    PLAN  Suspect allergies rather than infection. Steroid taper and symptom relief  Return if worsening or failing to improve. Can consider referral to allergy specialist or trying another course of abx  Continue flonase and OTC antihistamine  Patient encouraged to call clinic with any  questions, comments, or concerns.  I discussed the assessment and treatment plan with the patient. The patient was provided an opportunity to ask questions and all were answered. The patient agreed with the plan and demonstrated an understanding of the instructions.   The patient was advised to call back or seek an in-person evaluation if the symptoms worsen or if the condition fails to improve as anticipated.  I provided 14 minutes of non-face-to-face time during this encounter.  Janeece Agee, NP  Primary Care at [facility]

## 2020-05-23 ENCOUNTER — Telehealth: Payer: Self-pay | Admitting: Family Medicine

## 2020-05-23 NOTE — Telephone Encounter (Signed)
Pt called in stating that she saw Richard on 05/19/20, she states that she still have a cough and it is a croupy cough now. She is leaving to go out of town Thursday. She wanted to know what else she should do.  Please advise  Does she need another appt? If so do we do an in office evaluation or a video visit?   Pcp Tabori

## 2020-05-23 NOTE — Telephone Encounter (Signed)
It will likely take more than 4 days for things to improve.  She should take the Prednisone as directed.  Take a COVID test just to be sure (especially since she is going out of town).  Make sure she is taking a daily antihistamine like Claritin or Zyrtec in addition to the Astelin nasal spray.  Mucinex DM for cough and congestion.

## 2020-05-23 NOTE — Telephone Encounter (Signed)
Called patient with pcp recommendations. Patient voiced understanding. 

## 2020-05-27 DIAGNOSIS — J209 Acute bronchitis, unspecified: Secondary | ICD-10-CM | POA: Diagnosis not present

## 2020-07-05 ENCOUNTER — Encounter: Payer: Self-pay | Admitting: *Deleted

## 2020-08-05 DIAGNOSIS — N390 Urinary tract infection, site not specified: Secondary | ICD-10-CM | POA: Diagnosis not present

## 2020-09-28 DIAGNOSIS — L821 Other seborrheic keratosis: Secondary | ICD-10-CM | POA: Diagnosis not present

## 2020-09-28 DIAGNOSIS — L814 Other melanin hyperpigmentation: Secondary | ICD-10-CM | POA: Diagnosis not present

## 2020-09-28 DIAGNOSIS — D1801 Hemangioma of skin and subcutaneous tissue: Secondary | ICD-10-CM | POA: Diagnosis not present

## 2020-09-28 DIAGNOSIS — L570 Actinic keratosis: Secondary | ICD-10-CM | POA: Diagnosis not present

## 2020-09-28 DIAGNOSIS — D225 Melanocytic nevi of trunk: Secondary | ICD-10-CM | POA: Diagnosis not present

## 2020-10-17 DIAGNOSIS — N390 Urinary tract infection, site not specified: Secondary | ICD-10-CM | POA: Diagnosis not present

## 2020-10-22 ENCOUNTER — Other Ambulatory Visit: Payer: Self-pay | Admitting: Family Medicine

## 2020-10-23 DIAGNOSIS — Z23 Encounter for immunization: Secondary | ICD-10-CM | POA: Diagnosis not present

## 2020-11-08 ENCOUNTER — Other Ambulatory Visit: Payer: Self-pay

## 2020-11-08 ENCOUNTER — Ambulatory Visit (INDEPENDENT_AMBULATORY_CARE_PROVIDER_SITE_OTHER): Payer: BC Managed Care – PPO | Admitting: Family Medicine

## 2020-11-08 ENCOUNTER — Encounter: Payer: Self-pay | Admitting: Family Medicine

## 2020-11-08 VITALS — BP 110/80 | HR 76 | Temp 99.2°F | Resp 17 | Ht 65.0 in | Wt 142.0 lb

## 2020-11-08 DIAGNOSIS — Z860100 Personal history of colon polyps, unspecified: Secondary | ICD-10-CM | POA: Insufficient documentation

## 2020-11-08 DIAGNOSIS — M810 Age-related osteoporosis without current pathological fracture: Secondary | ICD-10-CM | POA: Diagnosis not present

## 2020-11-08 DIAGNOSIS — R109 Unspecified abdominal pain: Secondary | ICD-10-CM | POA: Insufficient documentation

## 2020-11-08 DIAGNOSIS — E785 Hyperlipidemia, unspecified: Secondary | ICD-10-CM | POA: Diagnosis not present

## 2020-11-08 DIAGNOSIS — Z0000 Encounter for general adult medical examination without abnormal findings: Secondary | ICD-10-CM

## 2020-11-08 DIAGNOSIS — M5430 Sciatica, unspecified side: Secondary | ICD-10-CM | POA: Insufficient documentation

## 2020-11-08 LAB — BASIC METABOLIC PANEL
BUN: 17 mg/dL (ref 6–23)
CO2: 32 mEq/L (ref 19–32)
Calcium: 9.2 mg/dL (ref 8.4–10.5)
Chloride: 104 mEq/L (ref 96–112)
Creatinine, Ser: 0.8 mg/dL (ref 0.40–1.20)
GFR: 79 mL/min (ref >60.00)
Glucose, Bld: 89 mg/dL (ref 70–99)
Potassium: 4 mEq/L (ref 3.5–5.1)
Sodium: 141 mEq/L (ref 135–145)

## 2020-11-08 LAB — CBC WITH DIFFERENTIAL/PLATELET
Basophils Absolute: 0 10*3/uL (ref 0.0–0.1)
Basophils Relative: 0.7 % (ref 0.0–3.0)
Eosinophils Absolute: 0.2 10*3/uL (ref 0.0–0.7)
Eosinophils Relative: 4.9 % (ref 0.0–5.0)
HCT: 38.4 % (ref 36.0–46.0)
Hemoglobin: 12.9 g/dL (ref 12.0–15.0)
Lymphocytes Relative: 35.2 % (ref 12.0–46.0)
Lymphs Abs: 1.5 10*3/uL (ref 0.7–4.0)
MCHC: 33.6 g/dL (ref 30.0–36.0)
MCV: 86.7 fl (ref 78.0–100.0)
Monocytes Absolute: 0.5 10*3/uL (ref 0.1–1.0)
Monocytes Relative: 10.5 % (ref 3.0–12.0)
Neutro Abs: 2.1 10*3/uL (ref 1.4–7.7)
Neutrophils Relative %: 48.7 % (ref 43.0–77.0)
Platelets: 286 10*3/uL (ref 150.0–400.0)
RBC: 4.43 Mil/uL (ref 3.87–5.11)
RDW: 13.4 % (ref 11.5–15.5)
WBC: 4.3 10*3/uL (ref 4.0–10.5)

## 2020-11-08 LAB — LIPID PANEL
Cholesterol: 220 mg/dL — ABNORMAL HIGH (ref 0–200)
HDL: 70.8 mg/dL (ref >39.00)
LDL Cholesterol: 135 mg/dL — ABNORMAL HIGH (ref 0–99)
NonHDL: 148.71
Total CHOL/HDL Ratio: 3
Triglycerides: 69 mg/dL (ref 0.0–149.0)
VLDL: 13.8 mg/dL (ref 0.0–40.0)

## 2020-11-08 LAB — TSH: TSH: 3.25 u[IU]/mL (ref 0.35–5.50)

## 2020-11-08 LAB — HEPATIC FUNCTION PANEL
ALT: 19 U/L (ref 0–35)
AST: 19 U/L (ref 0–37)
Albumin: 4.4 g/dL (ref 3.5–5.2)
Alkaline Phosphatase: 51 U/L (ref 39–117)
Bilirubin, Direct: 0.2 mg/dL (ref 0.0–0.3)
Total Bilirubin: 1.3 mg/dL — ABNORMAL HIGH (ref 0.2–1.2)
Total Protein: 6.4 g/dL (ref 6.0–8.3)

## 2020-11-08 LAB — VITAMIN D 25 HYDROXY (VIT D DEFICIENCY, FRACTURES): VITD: 54.54 ng/mL (ref 30.00–100.00)

## 2020-11-08 NOTE — Progress Notes (Signed)
   Subjective:    Patient ID: Melanie Park, female    DOB: 1960/10/17, 60 y.o.   MRN: [id number]  HPI CPE- UTD on mammo, colonoscopy, DEXA.  No need for pap due to hysterectomy.  UTD on Tdap.  Plans to get shingles shot but not today  Patient Care Team    Relationship Specialty Notifications Start End  Sheliah Hatch, MD PCP - General Family Medicine  08/02/13   Vida Rigger, MD Consulting Physician Gastroenterology  11/14/14     Health Maintenance  Topic Date Due   Zoster Vaccines- Shingrix (1 of 2) Never done   COVID-19 Vaccine (3 - Booster for Pfizer series) 11/02/2020   Hepatitis C Screening  11/08/2021 (Originally [birth year]/1976)   HIV Screening  11/08/2021 (Originally 06/29/1973)   DEXA SCAN  02/22/2021   MAMMOGRAM  03/07/2021   COLONOSCOPY (Pts 45-46yrs Insurance coverage will need to be confirmed)  07/20/2024   TETANUS/TDAP  08/02/2028   INFLUENZA VACCINE  Completed   Pneumococcal Vaccine 43-38 Years old  Aged Out   HPV VACCINES  Aged Out      Review of Systems Patient reports no vision/ hearing changes, adenopathy,fever, persistant/recurrent hoarseness , swallowing issues, chest pain, palpitations, edema, persistant/recurrent cough, hemoptysis, dyspnea (rest/exertional/paroxysmal nocturnal), gastrointestinal bleeding (melena, rectal bleeding), abdominal pain, significant heartburn, bowel changes, Gyn symptoms (abnormal  bleeding, pain),  syncope, focal weakness, memory loss, numbness & tingling, skin/hair/nail changes, abnormal bruising or bleeding, anxiety, or depression.   + 10 lb weight gain UTI- July 30th, Oct 7th  This visit occurred during the SARS-CoV-2 public health emergency.  Safety protocols were in place, including screening questions prior to the visit, additional usage of staff PPE, and extensive cleaning of exam room while observing appropriate contact time as indicated for disinfecting solutions.      Objective:   Physical Exam General Appearance:     Alert, cooperative, no distress, appears stated age  Head:    Normocephalic, without obvious abnormality, atraumatic  Eyes:    PERRL, conjunctiva/corneas clear, EOM's intact, fundi    benign, both eyes  Ears:    Normal TM's and external ear canals, both ears  Nose:   Deferred due to COVID  Throat:   Neck:   Supple, symmetrical, trachea midline, no adenopathy;    Thyroid: no enlargement/tenderness/nodules  Back:     Symmetric, no curvature, ROM normal, no CVA tenderness  Lungs:     Clear to auscultation bilaterally, respirations unlabored  Chest Wall:    No tenderness or deformity   Heart:    Regular rate and rhythm, S1 and S2 normal, no murmur, rub   or gallop  Breast Exam:    Deferred to mammo  Abdomen:     Soft, non-tender, bowel sounds active all four quadrants,    no masses, no organomegaly  Genitalia:    Deferred  Rectal:    Extremities:   Extremities normal, atraumatic, no cyanosis or edema  Pulses:   2+ and symmetric all extremities  Skin:   Skin color, texture, turgor normal, no rashes or lesions  Lymph nodes:   Cervical, supraclavicular, and axillary nodes normal  Neurologic:   CNII-XII intact, normal strength, sensation and reflexes    throughout          Assessment & Plan:

## 2020-11-08 NOTE — Assessment & Plan Note (Signed)
Pt's PE WNL.  UTD on mammo, colonoscopy, DEXA, Tdap.  Check labs.  Anticipatory guidance provided.  

## 2020-11-08 NOTE — Assessment & Plan Note (Signed)
Ongoing issue for pt.  Attempting to control w/ diet and exercise.  Check labs and determine if medication is needed

## 2020-11-08 NOTE — Patient Instructions (Signed)
Follow up in 1 year or as needed We'll notify you of your lab results and make any changes if needed Continue to work on healthy diet and regular exercise- you look great! Get your shingles shot at your convenience and let me know so we can update the chart Call with any questions or concerns Stay Safe!  Stay Healthy! Happy Holidays!!

## 2020-11-08 NOTE — Assessment & Plan Note (Signed)
Check Vit D level and replete prn. 

## 2021-02-09 ENCOUNTER — Encounter: Payer: Self-pay | Admitting: Family Medicine

## 2021-03-06 DIAGNOSIS — H2511 Age-related nuclear cataract, right eye: Secondary | ICD-10-CM | POA: Diagnosis not present

## 2021-03-06 DIAGNOSIS — H18413 Arcus senilis, bilateral: Secondary | ICD-10-CM | POA: Diagnosis not present

## 2021-03-06 DIAGNOSIS — H2513 Age-related nuclear cataract, bilateral: Secondary | ICD-10-CM | POA: Diagnosis not present

## 2021-03-06 DIAGNOSIS — H25013 Cortical age-related cataract, bilateral: Secondary | ICD-10-CM | POA: Diagnosis not present

## 2021-03-06 DIAGNOSIS — H25043 Posterior subcapsular polar age-related cataract, bilateral: Secondary | ICD-10-CM | POA: Diagnosis not present

## 2021-03-13 DIAGNOSIS — Z1231 Encounter for screening mammogram for malignant neoplasm of breast: Secondary | ICD-10-CM | POA: Diagnosis not present

## 2021-03-13 LAB — HM MAMMOGRAPHY

## 2021-03-15 ENCOUNTER — Encounter: Payer: Self-pay | Admitting: Family Medicine

## 2021-03-21 DIAGNOSIS — N3000 Acute cystitis without hematuria: Secondary | ICD-10-CM | POA: Diagnosis not present

## 2021-03-21 DIAGNOSIS — R300 Dysuria: Secondary | ICD-10-CM | POA: Diagnosis not present

## 2021-03-21 DIAGNOSIS — R933 Abnormal findings on diagnostic imaging of other parts of digestive tract: Secondary | ICD-10-CM | POA: Insufficient documentation

## 2021-03-22 ENCOUNTER — Ambulatory Visit: Payer: BC Managed Care – PPO | Admitting: Family Medicine

## 2021-04-22 ENCOUNTER — Other Ambulatory Visit: Payer: Self-pay | Admitting: Family Medicine

## 2021-04-27 ENCOUNTER — Encounter: Payer: Self-pay | Admitting: Family Medicine

## 2021-05-11 ENCOUNTER — Encounter: Payer: Self-pay | Admitting: Family Medicine

## 2021-05-15 DIAGNOSIS — N3001 Acute cystitis with hematuria: Secondary | ICD-10-CM | POA: Diagnosis not present

## 2021-05-16 ENCOUNTER — Ambulatory Visit: Payer: BC Managed Care – PPO | Admitting: Family Medicine

## 2021-05-21 DIAGNOSIS — H2511 Age-related nuclear cataract, right eye: Secondary | ICD-10-CM | POA: Diagnosis not present

## 2021-05-21 HISTORY — PX: CATARACT EXTRACTION: SUR2

## 2021-05-22 DIAGNOSIS — H2512 Age-related nuclear cataract, left eye: Secondary | ICD-10-CM | POA: Diagnosis not present

## 2021-05-28 ENCOUNTER — Encounter: Payer: Self-pay | Admitting: Family Medicine

## 2021-05-28 ENCOUNTER — Ambulatory Visit: Payer: BC Managed Care – PPO | Admitting: Family Medicine

## 2021-05-28 VITALS — BP 112/72 | HR 73 | Temp 98.0°F | Resp 16 | Ht 65.0 in | Wt 139.8 lb

## 2021-05-28 DIAGNOSIS — R350 Frequency of micturition: Secondary | ICD-10-CM | POA: Diagnosis not present

## 2021-05-28 DIAGNOSIS — R300 Dysuria: Secondary | ICD-10-CM | POA: Diagnosis not present

## 2021-05-28 DIAGNOSIS — R102 Pelvic and perineal pain: Secondary | ICD-10-CM | POA: Diagnosis not present

## 2021-05-28 LAB — POCT URINALYSIS DIPSTICK
Bilirubin, UA: NEGATIVE
Blood, UA: NEGATIVE
Glucose, UA: NEGATIVE
Ketones, UA: NEGATIVE
Leukocytes, UA: NEGATIVE
Nitrite, UA: NEGATIVE
Protein, UA: NEGATIVE
Spec Grav, UA: 1.01 (ref 1.010–1.025)
Urobilinogen, UA: 0.2 E.U./dL
pH, UA: 5 (ref 5.0–8.0)

## 2021-05-28 MED ORDER — NITROFURANTOIN MACROCRYSTAL 50 MG PO CAPS: 50.0000 mg | ORAL_CAPSULE | Freq: Every day | ORAL | 3 refills | Status: DC

## 2021-05-28 NOTE — Patient Instructions (Addendum)
Follow up as needed or as scheduled We'll call you with your Urology appt Continue to drink LOTS of water to flush that bladder!!! Start the Nitrofurantoin nightly to prevent UTI Call with any questions or concerns Hang in there!!!

## 2021-05-28 NOTE — Progress Notes (Signed)
   Subjective:    Patient ID: Melanie Park, female    DOB: 1960-12-14, 61 y.o.   MRN: [id number]  HPI UTIs- pt has been going to [facility] for at least 3 previous UTIs since March.  Has had Macrobid, Cipro, Cefixime.  Pt reports bladder 'pressure, heaviness' when standing, also has burning.  Had an episode on Saturday where by the end of the day she felt 'pressure and burning' like a UTI.  + frequency during these episodes 'and then i'll be fine'.  Asymptomatic today.  Denies fevers, chills, back pain.  Does not feel a bulge when wiping.  S/p hysterectomy.  Asking for prophylactic abx.   Review of Systems For ROS see HPI     Objective:   Physical Exam Vitals reviewed.  Constitutional:      General: She is not in acute distress.    Appearance: Normal appearance. She is not ill-appearing.  HENT:     Head: Normocephalic and atraumatic.  Cardiovascular:     Rate and Rhythm: Normal rate and regular rhythm.     Pulses: Normal pulses.  Pulmonary:     Effort: Pulmonary effort is normal. No respiratory distress.  Abdominal:     General: Abdomen is flat. There is no distension.     Palpations: Abdomen is soft.     Tenderness: There is no abdominal tenderness. There is no guarding or rebound.  Skin:    General: Skin is warm and dry.  Neurological:     General: No focal deficit present.     Mental Status: She is alert and oriented to person, place, and time.  Psychiatric:        Mood and Affect: Mood normal.        Behavior: Behavior normal.        Thought Content: Thought content normal.          Assessment & Plan:   Pelvic pressure, urinary frequency, burning, recurrent UTIs- new.  Pt had 3 UTIs last year and then 3 since March.  States that outside of those UTIs if she does a lot of standing or up and down bending/squatting she will develop pelvic pressure and burning- 'like a UTI but not when I urinate'.  Sxs will then improve.  She is s/p hysterectomy so doesn't have great  bladder support and I suspect there is some degree of bladder dropping that is causing her sxs.  No obvious prolapse when she's wiping.  Will refer to urology for complete evaluation.  In the meantime, pt asked for UTI prophylaxis.  Will start Macrodantin 50mg  nightly.  Pt expressed understanding and is in agreement w/ plan.

## 2021-05-29 DIAGNOSIS — H2512 Age-related nuclear cataract, left eye: Secondary | ICD-10-CM | POA: Diagnosis not present

## 2021-05-29 LAB — URINE CULTURE
MICRO NUMBER:: 13430242
Result:: NO GROWTH
SPECIMEN QUALITY:: ADEQUATE

## 2021-05-30 ENCOUNTER — Telehealth: Payer: Self-pay

## 2021-05-30 NOTE — Telephone Encounter (Signed)
-----   Message from Sheliah Hatch, MD sent at 05/30/2021  8:46 AM EDT ----- No evidence of UTI- great news!

## 2021-05-30 NOTE — Telephone Encounter (Signed)
Spoke w/ pt informed her of her urine result

## 2021-06-05 ENCOUNTER — Telehealth: Payer: Self-pay | Admitting: Family Medicine

## 2021-06-05 MED ORDER — NITROFURANTOIN MACROCRYSTAL 50 MG PO CAPS: 50.0000 mg | ORAL_CAPSULE | Freq: Every day | ORAL | 3 refills | Status: DC

## 2021-06-05 NOTE — Telephone Encounter (Signed)
Pt called in stating [place], she wanted to know if Dr Birdie Riddle could send in another script for her. I did make her aware that if this is ok by the provider that she still may have to pay out of pocket for the medication. Since it was just filled on 05/28/21

## 2021-06-11 DIAGNOSIS — H2512 Age-related nuclear cataract, left eye: Secondary | ICD-10-CM | POA: Diagnosis not present

## 2021-07-13 ENCOUNTER — Other Ambulatory Visit: Payer: Self-pay | Admitting: Urology

## 2021-07-13 DIAGNOSIS — N3021 Other chronic cystitis with hematuria: Secondary | ICD-10-CM | POA: Diagnosis not present

## 2021-07-13 DIAGNOSIS — R102 Pelvic and perineal pain: Secondary | ICD-10-CM

## 2021-07-18 ENCOUNTER — Ambulatory Visit
Admission: RE | Admit: 2021-07-18 | Discharge: 2021-07-18 | Disposition: A | Discharge status: Home / Self Care | Payer: BC Managed Care – PPO | Source: Ambulatory Visit | Attending: Urology | Admitting: Urology

## 2021-07-18 DIAGNOSIS — R102 Pelvic and perineal pain: Secondary | ICD-10-CM

## 2021-07-18 DIAGNOSIS — Z90710 Acquired absence of both cervix and uterus: Secondary | ICD-10-CM | POA: Diagnosis not present

## 2021-09-29 ENCOUNTER — Other Ambulatory Visit: Payer: Self-pay | Admitting: Family Medicine

## 2021-10-02 DIAGNOSIS — D229 Melanocytic nevi, unspecified: Secondary | ICD-10-CM | POA: Diagnosis not present

## 2021-10-02 DIAGNOSIS — L578 Other skin changes due to chronic exposure to nonionizing radiation: Secondary | ICD-10-CM | POA: Diagnosis not present

## 2021-10-02 DIAGNOSIS — L570 Actinic keratosis: Secondary | ICD-10-CM | POA: Diagnosis not present

## 2021-10-02 DIAGNOSIS — L814 Other melanin hyperpigmentation: Secondary | ICD-10-CM | POA: Diagnosis not present

## 2021-10-02 DIAGNOSIS — L821 Other seborrheic keratosis: Secondary | ICD-10-CM | POA: Diagnosis not present

## 2021-10-14 ENCOUNTER — Other Ambulatory Visit: Payer: Self-pay | Admitting: Family Medicine

## 2021-10-19 DIAGNOSIS — R102 Pelvic and perineal pain: Secondary | ICD-10-CM | POA: Diagnosis not present

## 2021-10-19 DIAGNOSIS — N3021 Other chronic cystitis with hematuria: Secondary | ICD-10-CM | POA: Diagnosis not present

## 2021-10-26 DIAGNOSIS — Z23 Encounter for immunization: Secondary | ICD-10-CM | POA: Diagnosis not present

## 2021-11-09 ENCOUNTER — Ambulatory Visit (INDEPENDENT_AMBULATORY_CARE_PROVIDER_SITE_OTHER): Payer: BC Managed Care – PPO | Admitting: Family Medicine

## 2021-11-09 ENCOUNTER — Encounter: Payer: Self-pay | Admitting: Family Medicine

## 2021-11-09 VITALS — BP 122/74 | HR 69 | Temp 97.1°F | Resp 17 | Ht 65.0 in | Wt 137.2 lb

## 2021-11-09 DIAGNOSIS — Z0000 Encounter for general adult medical examination without abnormal findings: Secondary | ICD-10-CM

## 2021-11-09 DIAGNOSIS — M810 Age-related osteoporosis without current pathological fracture: Secondary | ICD-10-CM

## 2021-11-09 DIAGNOSIS — E785 Hyperlipidemia, unspecified: Secondary | ICD-10-CM | POA: Diagnosis not present

## 2021-11-09 LAB — HEPATIC FUNCTION PANEL
ALT: 26 U/L (ref 0–35)
AST: 24 U/L (ref 0–37)
Albumin: 4.3 g/dL (ref 3.5–5.2)
Alkaline Phosphatase: 50 U/L (ref 39–117)
Bilirubin, Direct: 0.2 mg/dL (ref 0.0–0.3)
Total Bilirubin: 1.2 mg/dL (ref 0.2–1.2)
Total Protein: 6.7 g/dL (ref 6.0–8.3)

## 2021-11-09 LAB — CBC WITH DIFFERENTIAL/PLATELET
Basophils Absolute: 0 10*3/uL (ref 0.0–0.1)
Basophils Relative: 0.9 % (ref 0.0–3.0)
Eosinophils Absolute: 0.2 10*3/uL (ref 0.0–0.7)
Eosinophils Relative: 3.4 % (ref 0.0–5.0)
HCT: 38.5 % (ref 36.0–46.0)
Hemoglobin: 13.1 g/dL (ref 12.0–15.0)
Lymphocytes Relative: 26.4 % (ref 12.0–46.0)
Lymphs Abs: 1.3 10*3/uL (ref 0.7–4.0)
MCHC: 33.9 g/dL (ref 30.0–36.0)
MCV: 86.3 fl (ref 78.0–100.0)
Monocytes Absolute: 0.4 10*3/uL (ref 0.1–1.0)
Monocytes Relative: 8.3 % (ref 3.0–12.0)
Neutro Abs: 3 10*3/uL (ref 1.4–7.7)
Neutrophils Relative %: 61 % (ref 43.0–77.0)
Platelets: 289 10*3/uL (ref 150.0–400.0)
RBC: 4.46 Mil/uL (ref 3.87–5.11)
RDW: 13.5 % (ref 11.5–15.5)
WBC: 4.9 10*3/uL (ref 4.0–10.5)

## 2021-11-09 LAB — BASIC METABOLIC PANEL
BUN: 20 mg/dL (ref 6–23)
CO2: 31 mEq/L (ref 19–32)
Calcium: 9.5 mg/dL (ref 8.4–10.5)
Chloride: 104 mEq/L (ref 96–112)
Creatinine, Ser: 0.79 mg/dL (ref 0.40–1.20)
GFR: 79.64 mL/min (ref >60.00)
Glucose, Bld: 87 mg/dL (ref 70–99)
Potassium: 4.2 mEq/L (ref 3.5–5.1)
Sodium: 141 mEq/L (ref 135–145)

## 2021-11-09 LAB — TSH: TSH: 2.8 u[IU]/mL (ref 0.35–5.50)

## 2021-11-09 LAB — LIPID PANEL
Cholesterol: 222 mg/dL — ABNORMAL HIGH (ref 0–200)
HDL: 70.3 mg/dL (ref >39.00)
LDL Cholesterol: 133 mg/dL — ABNORMAL HIGH (ref 0–99)
NonHDL: 151.5
Total CHOL/HDL Ratio: 3
Triglycerides: 92 mg/dL (ref 0.0–149.0)
VLDL: 18.4 mg/dL (ref 0.0–40.0)

## 2021-11-09 LAB — VITAMIN D 25 HYDROXY (VIT D DEFICIENCY, FRACTURES): VITD: 64.62 ng/mL (ref 30.00–100.00)

## 2021-11-09 NOTE — Progress Notes (Signed)
   Subjective:    Patient ID: Melanie Park, female    DOB: [birth year], 61 y.o.   MRN: [id number]  HPI CPE- UTD on mammo, DEXA, colonoscopy, Tdap.  No concerns today  Patient Care Team    Relationship Specialty Notifications Start End  Midge Minium, MD PCP - General Family Medicine  08/02/13   Clarene Essex, MD Consulting Physician Gastroenterology  11/14/14     Health Maintenance  Topic Date Due   MAMMOGRAM  03/14/2022   DEXA SCAN  05/24/2022   COLONOSCOPY (Pts 45-25yrs Insurance coverage will need to be confirmed)  07/20/2024   TETANUS/TDAP  08/02/2028   INFLUENZA VACCINE  Completed   Zoster Vaccines- Shingrix  Completed   HPV VACCINES  Aged Out   COVID-19 Vaccine  Discontinued   Hepatitis C Screening  Discontinued   HIV Screening  Discontinued      Review of Systems Patient reports no vision/ hearing changes, adenopathy,fever, weight change,  persistant/recurrent hoarseness , swallowing issues, chest pain, palpitations, edema, persistant/recurrent cough, hemoptysis, dyspnea (rest/exertional/paroxysmal nocturnal), gastrointestinal bleeding (melena, rectal bleeding), abdominal pain, significant heartburn, bowel changes, GU symptoms (dysuria, hematuria, incontinence), Gyn symptoms (abnormal  bleeding, pain),  syncope, focal weakness, memory loss, numbness & tingling, skin/hair/nail changes, abnormal bruising or bleeding, anxiety, or depression.     Objective:   Physical Exam General Appearance:    Alert, cooperative, no distress, appears stated age  Head:    Normocephalic, without obvious abnormality, atraumatic  Eyes:    PERRL, conjunctiva/corneas clear, EOM's intact both eyes  Ears:    Normal TM's and external ear canals, both ears  Nose:   Nares normal, septum midline, mucosa normal, no drainage    or sinus tenderness  Throat:   Lips, mucosa, and tongue normal; teeth and gums normal  Neck:   Supple, symmetrical, trachea midline, no adenopathy;    Thyroid: no  enlargement/tenderness/nodules  Back:     Symmetric, no curvature, ROM normal, no CVA tenderness  Lungs:     Clear to auscultation bilaterally, respirations unlabored  Chest Wall:    No tenderness or deformity   Heart:    Regular rate and rhythm, S1 and S2 normal, no murmur, rub   or gallop  Breast Exam:    Deferred to mammo  Abdomen:     Soft, non-tender, bowel sounds active all four quadrants,    no masses, no organomegaly  Genitalia:    Deferred  Rectal:    Extremities:   Extremities normal, atraumatic, no cyanosis or edema  Pulses:   2+ and symmetric all extremities  Skin:   Skin color, texture, turgor normal, no rashes or lesions  Lymph nodes:   Cervical, supraclavicular, and axillary nodes normal  Neurologic:   CNII-XII intact, normal strength, sensation and reflexes    throughout          Assessment & Plan:

## 2021-11-09 NOTE — Patient Instructions (Signed)
Follow up in 1 year or as needed We'll notify you of your lab results and make any changes if needed Keep up the good work!  You look great!!! Use the OTC Voltaren (Diclofenac) Gel to help w/ joint pain Call with any questions or concerns Stay Safe!  Stay Healthy! Happy Fall!!!

## 2021-11-09 NOTE — Assessment & Plan Note (Signed)
Pt's PE WNL.  UTD on mammo, colonoscopy, Tdap, DEXA, flu.  Check labs.  Anticipatory guidance provided.

## 2021-11-09 NOTE — Assessment & Plan Note (Signed)
Check Vit D and replete prn.  UTD on DEXA 

## 2021-11-12 NOTE — Progress Notes (Signed)
Called and spoke to patient about there lab work

## 2022-02-14 ENCOUNTER — Other Ambulatory Visit: Payer: Self-pay | Admitting: Family Medicine

## 2022-02-22 DIAGNOSIS — N3021 Other chronic cystitis with hematuria: Secondary | ICD-10-CM | POA: Diagnosis not present

## 2022-02-22 DIAGNOSIS — R102 Pelvic and perineal pain: Secondary | ICD-10-CM | POA: Diagnosis not present

## 2022-03-18 ENCOUNTER — Other Ambulatory Visit: Payer: Self-pay | Admitting: Family Medicine

## 2022-03-19 DIAGNOSIS — Z1231 Encounter for screening mammogram for malignant neoplasm of breast: Secondary | ICD-10-CM | POA: Diagnosis not present

## 2022-03-19 LAB — HM MAMMOGRAPHY

## 2022-03-21 ENCOUNTER — Encounter: Payer: Self-pay | Admitting: Family Medicine

## 2022-06-24 ENCOUNTER — Other Ambulatory Visit: Payer: Self-pay | Admitting: Family Medicine

## 2022-08-31 ENCOUNTER — Other Ambulatory Visit: Payer: Self-pay | Admitting: Family Medicine

## 2022-10-08 DIAGNOSIS — D229 Melanocytic nevi, unspecified: Secondary | ICD-10-CM | POA: Diagnosis not present

## 2022-10-08 DIAGNOSIS — L820 Inflamed seborrheic keratosis: Secondary | ICD-10-CM | POA: Diagnosis not present

## 2022-10-08 DIAGNOSIS — L578 Other skin changes due to chronic exposure to nonionizing radiation: Secondary | ICD-10-CM | POA: Diagnosis not present

## 2022-10-08 DIAGNOSIS — L821 Other seborrheic keratosis: Secondary | ICD-10-CM | POA: Diagnosis not present

## 2022-10-08 DIAGNOSIS — L814 Other melanin hyperpigmentation: Secondary | ICD-10-CM | POA: Diagnosis not present

## 2022-10-18 DIAGNOSIS — N3021 Other chronic cystitis with hematuria: Secondary | ICD-10-CM | POA: Diagnosis not present

## 2022-10-18 DIAGNOSIS — R102 Pelvic and perineal pain: Secondary | ICD-10-CM | POA: Diagnosis not present

## 2022-10-28 DIAGNOSIS — Z23 Encounter for immunization: Secondary | ICD-10-CM | POA: Diagnosis not present

## 2022-11-13 ENCOUNTER — Encounter: Payer: Self-pay | Admitting: Family Medicine

## 2022-11-13 ENCOUNTER — Ambulatory Visit (INDEPENDENT_AMBULATORY_CARE_PROVIDER_SITE_OTHER): Payer: BC Managed Care – PPO | Admitting: Family Medicine

## 2022-11-13 VITALS — BP 102/72 | HR 70 | Temp 98.3°F | Ht 64.5 in | Wt 141.4 lb

## 2022-11-13 DIAGNOSIS — Z114 Encounter for screening for human immunodeficiency virus [HIV]: Secondary | ICD-10-CM | POA: Diagnosis not present

## 2022-11-13 DIAGNOSIS — Z1159 Encounter for screening for other viral diseases: Secondary | ICD-10-CM | POA: Diagnosis not present

## 2022-11-13 DIAGNOSIS — Z0000 Encounter for general adult medical examination without abnormal findings: Secondary | ICD-10-CM | POA: Diagnosis not present

## 2022-11-13 DIAGNOSIS — E785 Hyperlipidemia, unspecified: Secondary | ICD-10-CM | POA: Diagnosis not present

## 2022-11-13 DIAGNOSIS — M810 Age-related osteoporosis without current pathological fracture: Secondary | ICD-10-CM | POA: Diagnosis not present

## 2022-11-13 LAB — CBC WITH DIFFERENTIAL/PLATELET
Basophils Absolute: 0 10*3/uL (ref 0.0–0.1)
Basophils Relative: 0.5 % (ref 0.0–3.0)
Eosinophils Absolute: 0.1 10*3/uL (ref 0.0–0.7)
Eosinophils Relative: 2.9 % (ref 0.0–5.0)
HCT: 41.1 % (ref 36.0–46.0)
Hemoglobin: 13.7 g/dL (ref 12.0–15.0)
Lymphocytes Relative: 29.1 % (ref 12.0–46.0)
Lymphs Abs: 1.5 10*3/uL (ref 0.7–4.0)
MCHC: 33.3 g/dL (ref 30.0–36.0)
MCV: 87.7 fL (ref 78.0–100.0)
Monocytes Absolute: 0.4 10*3/uL (ref 0.1–1.0)
Monocytes Relative: 8.3 % (ref 3.0–12.0)
Neutro Abs: 3 10*3/uL (ref 1.4–7.7)
Neutrophils Relative %: 59.2 % (ref 43.0–77.0)
Platelets: 329 10*3/uL (ref 150.0–400.0)
RBC: 4.69 Mil/uL (ref 3.87–5.11)
RDW: 13.4 % (ref 11.5–15.5)
WBC: 5 10*3/uL (ref 4.0–10.5)

## 2022-11-13 LAB — BASIC METABOLIC PANEL
BUN: 18 mg/dL (ref 6–23)
CO2: 31 meq/L (ref 19–32)
Calcium: 9.8 mg/dL (ref 8.4–10.5)
Chloride: 102 meq/L (ref 96–112)
Creatinine, Ser: 0.88 mg/dL (ref 0.40–1.20)
GFR: 69.47 mL/min (ref >60.00)
Glucose, Bld: 90 mg/dL (ref 70–99)
Potassium: 4.1 meq/L (ref 3.5–5.1)
Sodium: 139 meq/L (ref 135–145)

## 2022-11-13 LAB — LIPID PANEL
Cholesterol: 233 mg/dL — ABNORMAL HIGH (ref 0–200)
HDL: 69.1 mg/dL (ref >39.00)
LDL Cholesterol: 143 mg/dL — ABNORMAL HIGH (ref 0–99)
NonHDL: 163.61
Total CHOL/HDL Ratio: 3
Triglycerides: 105 mg/dL (ref 0.0–149.0)
VLDL: 21 mg/dL (ref 0.0–40.0)

## 2022-11-13 LAB — HEPATIC FUNCTION PANEL
ALT: 29 U/L (ref 0–35)
AST: 23 U/L (ref 0–37)
Albumin: 4.4 g/dL (ref 3.5–5.2)
Alkaline Phosphatase: 57 U/L (ref 39–117)
Bilirubin, Direct: 0.1 mg/dL (ref 0.0–0.3)
Total Bilirubin: 1.3 mg/dL — ABNORMAL HIGH (ref 0.2–1.2)
Total Protein: 7.1 g/dL (ref 6.0–8.3)

## 2022-11-13 LAB — VITAMIN D 25 HYDROXY (VIT D DEFICIENCY, FRACTURES): VITD: 75.31 ng/mL (ref 30.00–100.00)

## 2022-11-13 LAB — TSH: TSH: 2.69 u[IU]/mL (ref 0.35–5.50)

## 2022-11-13 NOTE — Assessment & Plan Note (Signed)
Attempting to control w/ diet and exercise.  Check labs and determine if meds are needed ?

## 2022-11-13 NOTE — Assessment & Plan Note (Signed)
Pt's PE WNL.  UTD on mammo, colonoscopy, Tdap.  Flu shot given today.  Check labs.  Anticipatory guidance provided.

## 2022-11-13 NOTE — Assessment & Plan Note (Signed)
Repeat DEXA and check Vit D.  Replete prn.

## 2022-11-13 NOTE — Patient Instructions (Signed)
Follow up in 1 year or as needed We'll notify you of your lab results and make any changes if needed Keep up the good work!  You look great! Call with any questions or concerns Stay Safe!  Stay Healthy! ENJOY YOUR TRIP!!!

## 2022-11-13 NOTE — Progress Notes (Signed)
   Subjective:    Patient ID: Melanie Park, female    DOB: 1960-12-31, 62 y.o.   MRN: [id number]  HPI CPE- UTD on mammo, colonoscopy, Tdap.  Flu shot given.  Pt reports feeling well  Patient Care Team    Relationship Specialty Notifications Start End  Sheliah Hatch, MD PCP - General Family Medicine  08/02/13   Vida Rigger, MD Consulting Physician Gastroenterology  11/14/14     Health Maintenance  Topic Date Due   HIV Screening  Never done   Hepatitis C Screening  Never done   COVID-19 Vaccine (3 - Pfizer risk series) 10/05/2020   DEXA SCAN  05/24/2022   MAMMOGRAM  03/19/2023   Colonoscopy  07/20/2024   DTaP/Tdap/Td (3 - Td or Tdap) 08/02/2028   INFLUENZA VACCINE  Completed   Zoster Vaccines- Shingrix  Completed   HPV VACCINES  Aged Out      Review of Systems Patient reports no vision/ hearing changes, adenopathy,fever, weight change,  persistant/recurrent hoarseness , swallowing issues, chest pain, palpitations, edema, persistant/recurrent cough, hemoptysis, dyspnea (rest/exertional/paroxysmal nocturnal), gastrointestinal bleeding (melena, rectal bleeding), abdominal pain, significant heartburn, bowel changes, GU symptoms (dysuria, hematuria, incontinence), Gyn symptoms (abnormal  bleeding, pain),  syncope, focal weakness, memory loss, numbness & tingling, skin/hair/nail changes, abnormal bruising or bleeding, anxiety, or depression.     Objective:   Physical Exam General Appearance:    Alert, cooperative, no distress, appears stated age  Head:    Normocephalic, without obvious abnormality, atraumatic  Eyes:    PERRL, conjunctiva/corneas clear, EOM's intact both eyes  Ears:    Normal TM's and external ear canals, both ears  Nose:   Nares normal, septum midline, mucosa normal, no drainage    or sinus tenderness  Throat:   Lips, mucosa, and tongue normal; teeth and gums normal  Neck:   Supple, symmetrical, trachea midline, no adenopathy;    Thyroid: no  enlargement/tenderness/nodules  Back:     Symmetric, no curvature, ROM normal, no CVA tenderness  Lungs:     Clear to auscultation bilaterally, respirations unlabored  Chest Wall:    No tenderness or deformity   Heart:    Regular rate and rhythm, S1 and S2 normal, no murmur, rub   or gallop  Breast Exam:    Deferred to GYN  Abdomen:     Soft, non-tender, bowel sounds active all four quadrants,    no masses, no organomegaly  Genitalia:    Deferred to GYN  Rectal:    Extremities:   Extremities normal, atraumatic, no cyanosis or edema  Pulses:   2+ and symmetric all extremities  Skin:   Skin color, texture, turgor normal, no rashes or lesions  Lymph nodes:   Cervical, supraclavicular, and axillary nodes normal  Neurologic:   CNII-XII intact, normal strength, sensation and reflexes    throughout          Assessment & Plan:

## 2022-11-14 ENCOUNTER — Telehealth: Payer: Self-pay

## 2022-11-14 LAB — HIV ANTIBODY (ROUTINE TESTING W REFLEX): HIV 1&2 Ab, 4th Generation: NONREACTIVE

## 2022-11-14 LAB — HEPATITIS C ANTIBODY: Hepatitis C Ab: NONREACTIVE

## 2022-11-14 NOTE — Telephone Encounter (Signed)
-----   Message from Neena Rhymes sent at 11/14/2022  3:26 PM EST ----- Total cholesterol and LDL (bad cholesterol) have both climbed by ~10 points.  This will improve w/ healthy diet and regular exercise.  Thankfully your ratio of good to bad cholesterol is outstanding.  No medication needed at this time.  Remainder of labs look great!

## 2022-12-30 DIAGNOSIS — M8588 Other specified disorders of bone density and structure, other site: Secondary | ICD-10-CM | POA: Diagnosis not present

## 2022-12-30 DIAGNOSIS — M810 Age-related osteoporosis without current pathological fracture: Secondary | ICD-10-CM | POA: Diagnosis not present

## 2022-12-30 LAB — HM DEXA SCAN

## 2022-12-31 ENCOUNTER — Encounter: Payer: Self-pay | Admitting: Family Medicine

## 2023-01-19 ENCOUNTER — Encounter: Payer: Self-pay | Admitting: Family Medicine

## 2023-03-25 ENCOUNTER — Other Ambulatory Visit: Payer: Self-pay | Admitting: Family Medicine

## 2023-03-25 DIAGNOSIS — Z1231 Encounter for screening mammogram for malignant neoplasm of breast: Secondary | ICD-10-CM | POA: Diagnosis not present

## 2023-03-25 LAB — HM MAMMOGRAPHY

## 2023-04-02 ENCOUNTER — Encounter: Payer: Self-pay | Admitting: Family Medicine

## 2023-09-11 ENCOUNTER — Other Ambulatory Visit: Payer: Self-pay | Admitting: Family Medicine

## 2023-10-08 DIAGNOSIS — D235 Other benign neoplasm of skin of trunk: Secondary | ICD-10-CM | POA: Diagnosis not present

## 2023-10-08 DIAGNOSIS — D225 Melanocytic nevi of trunk: Secondary | ICD-10-CM | POA: Diagnosis not present

## 2023-10-08 DIAGNOSIS — D229 Melanocytic nevi, unspecified: Secondary | ICD-10-CM | POA: Diagnosis not present

## 2023-10-08 DIAGNOSIS — L814 Other melanin hyperpigmentation: Secondary | ICD-10-CM | POA: Diagnosis not present

## 2023-10-08 DIAGNOSIS — L821 Other seborrheic keratosis: Secondary | ICD-10-CM | POA: Diagnosis not present

## 2023-10-08 DIAGNOSIS — L820 Inflamed seborrheic keratosis: Secondary | ICD-10-CM | POA: Diagnosis not present

## 2023-10-08 DIAGNOSIS — L570 Actinic keratosis: Secondary | ICD-10-CM | POA: Diagnosis not present

## 2023-10-08 DIAGNOSIS — D485 Neoplasm of uncertain behavior of skin: Secondary | ICD-10-CM | POA: Diagnosis not present

## 2023-10-08 DIAGNOSIS — L578 Other skin changes due to chronic exposure to nonionizing radiation: Secondary | ICD-10-CM | POA: Diagnosis not present

## 2023-11-12 DIAGNOSIS — R102 Pelvic and perineal pain unspecified side: Secondary | ICD-10-CM | POA: Diagnosis not present

## 2023-11-12 DIAGNOSIS — N3020 Other chronic cystitis without hematuria: Secondary | ICD-10-CM | POA: Diagnosis not present

## 2023-11-13 DIAGNOSIS — Z23 Encounter for immunization: Secondary | ICD-10-CM | POA: Diagnosis not present

## 2023-11-24 ENCOUNTER — Ambulatory Visit (INDEPENDENT_AMBULATORY_CARE_PROVIDER_SITE_OTHER): Admitting: Family Medicine

## 2023-11-24 ENCOUNTER — Encounter: Payer: Self-pay | Admitting: Family Medicine

## 2023-11-24 VITALS — BP 118/86 | HR 59 | Temp 97.9°F | Ht 65.0 in | Wt 143.0 lb

## 2023-11-24 DIAGNOSIS — E785 Hyperlipidemia, unspecified: Secondary | ICD-10-CM | POA: Diagnosis not present

## 2023-11-24 DIAGNOSIS — M810 Age-related osteoporosis without current pathological fracture: Secondary | ICD-10-CM

## 2023-11-24 DIAGNOSIS — Z0000 Encounter for general adult medical examination without abnormal findings: Secondary | ICD-10-CM | POA: Diagnosis not present

## 2023-11-24 LAB — CBC WITH DIFFERENTIAL/PLATELET
Basophils Absolute: 0.1 K/uL (ref 0.0–0.1)
Basophils Relative: 0.9 % (ref 0.0–3.0)
Eosinophils Absolute: 0.1 K/uL (ref 0.0–0.7)
Eosinophils Relative: 2.5 % (ref 0.0–5.0)
HCT: 39.4 % (ref 36.0–46.0)
Hemoglobin: 13.3 g/dL (ref 12.0–15.0)
Lymphocytes Relative: 31.5 % (ref 12.0–46.0)
Lymphs Abs: 1.7 K/uL (ref 0.7–4.0)
MCHC: 33.7 g/dL (ref 30.0–36.0)
MCV: 87.1 fl (ref 78.0–100.0)
Monocytes Absolute: 0.5 K/uL (ref 0.1–1.0)
Monocytes Relative: 8.4 % (ref 3.0–12.0)
Neutro Abs: 3.1 K/uL (ref 1.4–7.7)
Neutrophils Relative %: 56.7 % (ref 43.0–77.0)
Platelets: 291 K/uL (ref 150.0–400.0)
RBC: 4.52 Mil/uL (ref 3.87–5.11)
RDW: 13.2 % (ref 11.5–15.5)
WBC: 5.5 K/uL (ref 4.0–10.5)

## 2023-11-24 LAB — VITAMIN D 25 HYDROXY (VIT D DEFICIENCY, FRACTURES): VITD: 63.51 ng/mL (ref 30.00–100.00)

## 2023-11-24 LAB — TSH: TSH: 2.36 u[IU]/mL (ref 0.35–5.50)

## 2023-11-24 NOTE — Progress Notes (Signed)
   Subjective:    Patient ID: Melanie Park, female    DOB: [birth year], 63 y.o.   MRN: [id number]  HPI CPE- UTD on mammo, colonoscopy, DEXA, Tdap.  Declines PNA.  Health Maintenance  Topic Date Due   Pneumococcal Vaccine: 50+ Years (1 of 1 - PCV) Never done   COVID-19 Vaccine (4 - 2025-26 season) 09/08/2023   Mammogram  03/24/2024   Colonoscopy  07/20/2024   DEXA SCAN  12/29/2024   DTaP/Tdap/Td (3 - Td or Tdap) 08/02/2028   Influenza Vaccine  Completed   Hepatitis C Screening  Completed   HIV Screening  Completed   Zoster Vaccines- Shingrix  Completed   Hepatitis B Vaccines 19-59 Average Risk  Aged Out   Meningococcal B Vaccine  Aged Out    Patient Care Team    Relationship Specialty Notifications Start End  Mahlon Comer BRAVO, MD PCP - General Family Medicine  08/02/13   Rosalie Kitchens, MD Consulting Physician Gastroenterology  11/14/14       Review of Systems Patient reports no vision/ hearing changes, adenopathy,fever, weight change,  persistant/recurrent hoarseness , swallowing issues, chest pain, palpitations, edema, persistant/recurrent cough, hemoptysis, dyspnea (rest/exertional/paroxysmal nocturnal), gastrointestinal bleeding (melena, rectal bleeding), abdominal pain, significant heartburn, bowel changes, GU symptoms (dysuria, hematuria, incontinence), Gyn symptoms (abnormal  bleeding, pain),  syncope, focal weakness, memory loss, numbness & tingling, skin/hair/nail changes, abnormal bruising or bleeding, anxiety, or depression.     Objective:   Physical Exam General Appearance:    Alert, cooperative, no distress, appears stated age  Head:    Normocephalic, without obvious abnormality, atraumatic  Eyes:    PERRL, conjunctiva/corneas clear, EOM's intact both eyes  Ears:    Normal TM's and external ear canals, both ears  Nose:   Nares normal, septum midline, mucosa normal, no drainage    or sinus tenderness  Throat:   Lips, mucosa, and tongue normal; teeth and gums normal   Neck:   Supple, symmetrical, trachea midline, no adenopathy;    Thyroid : no enlargement/tenderness/nodules  Back:     Symmetric, no curvature, ROM normal, no CVA tenderness  Lungs:     Clear to auscultation bilaterally, respirations unlabored  Chest Wall:    No tenderness or deformity   Heart:    Regular rate and rhythm, S1 and S2 normal, no murmur, rub   or gallop  Breast Exam:    Deferred to GYN  Abdomen:     Soft, non-tender, bowel sounds active all four quadrants,    no masses, no organomegaly  Genitalia:    Deferred to GYN  Rectal:    Extremities:   Extremities normal, atraumatic, no cyanosis or edema  Pulses:   2+ and symmetric all extremities  Skin:   Skin color, texture, turgor normal, no rashes or lesions  Lymph nodes:   Cervical, supraclavicular, and axillary nodes normal  Neurologic:   CNII-XII intact, normal strength, sensation and reflexes    throughout          Assessment & Plan:

## 2023-11-24 NOTE — Patient Instructions (Signed)
Follow up in 1 year or as needed We'll notify you of your lab results and make any changes if needed Keep up the good work on healthy diet and regular exercise- you look great! Call with any questions or concerns Stay Safe! Stay Healthy! Happy Holidays!!! 

## 2023-11-24 NOTE — Assessment & Plan Note (Signed)
 Pt's PE WNL.  UTD on mammo, colonoscopy, DEXA, Tdap.  Declined PNA vaccine.  Check labs.  Anticipatory guidance provided.

## 2023-11-24 NOTE — Assessment & Plan Note (Signed)
Chronic problem.  Attempting to control w/ diet and exercise.  Check labs.  Start meds prn. 

## 2023-11-25 LAB — HEPATIC FUNCTION PANEL
ALT: 21 U/L (ref 0–35)
AST: 19 U/L (ref 0–37)
Albumin: 4.4 g/dL (ref 3.5–5.2)
Alkaline Phosphatase: 51 U/L (ref 39–117)
Bilirubin, Direct: 0.1 mg/dL (ref 0.0–0.3)
Total Bilirubin: 1.1 mg/dL (ref 0.2–1.2)
Total Protein: 6.9 g/dL (ref 6.0–8.3)

## 2023-11-25 LAB — LIPID PANEL
Cholesterol: 222 mg/dL — ABNORMAL HIGH (ref 0–200)
HDL: 70.1 mg/dL (ref >39.00)
LDL Cholesterol: 137 mg/dL — ABNORMAL HIGH (ref 0–99)
NonHDL: 151.92
Total CHOL/HDL Ratio: 3
Triglycerides: 75 mg/dL (ref 0.0–149.0)
VLDL: 15 mg/dL (ref 0.0–40.0)

## 2023-11-25 LAB — BASIC METABOLIC PANEL WITH GFR
BUN: 19 mg/dL (ref 6–23)
CO2: 27 meq/L (ref 19–32)
Calcium: 9.7 mg/dL (ref 8.4–10.5)
Chloride: 102 meq/L (ref 96–112)
Creatinine, Ser: 0.76 mg/dL (ref 0.40–1.20)
GFR: 82.24 mL/min (ref >60.00)
Glucose, Bld: 84 mg/dL (ref 70–99)
Potassium: 3.7 meq/L (ref 3.5–5.1)
Sodium: 141 meq/L (ref 135–145)

## 2023-11-26 ENCOUNTER — Ambulatory Visit: Payer: Self-pay | Admitting: Family Medicine

## 2024-11-24 ENCOUNTER — Encounter: Admitting: Family Medicine
